# Patient Record
Sex: Female | Born: 1955 | Race: White | Hispanic: No | Marital: Married | Smoking: Never smoker
Health system: Southern US, Community
[De-identification: ages and names within clinical notes are randomized; demographics above are authoritative.]

## PROBLEM LIST (undated history)

## (undated) HISTORY — PX: WISDOM TOOTH EXTRACTION: SHX21

---

## 2007-07-05 HISTORY — PX: COLONOSCOPY: SHX174

## 2017-08-16 ENCOUNTER — Ambulatory Visit (INDEPENDENT_AMBULATORY_CARE_PROVIDER_SITE_OTHER): Payer: 59 | Admitting: Women's Health

## 2017-08-16 ENCOUNTER — Encounter: Payer: Self-pay | Admitting: Women's Health

## 2017-08-16 VITALS — BP 124/80 | Ht 63.0 in | Wt 151.0 lb

## 2017-08-16 DIAGNOSIS — E559 Vitamin D deficiency, unspecified: Secondary | ICD-10-CM | POA: Diagnosis not present

## 2017-08-16 DIAGNOSIS — Z13820 Encounter for screening for osteoporosis: Secondary | ICD-10-CM | POA: Diagnosis not present

## 2017-08-16 DIAGNOSIS — Z23 Encounter for immunization: Secondary | ICD-10-CM

## 2017-08-16 DIAGNOSIS — Z01419 Encounter for gynecological examination (general) (routine) without abnormal findings: Secondary | ICD-10-CM | POA: Diagnosis not present

## 2017-08-16 DIAGNOSIS — Z13220 Encounter for screening for lipoid disorders: Secondary | ICD-10-CM

## 2017-08-16 LAB — CBC WITH DIFFERENTIAL/PLATELET
BASOS PCT: 0.6 %
Basophils Absolute: 30 cells/uL (ref 0–200)
EOS ABS: 90 {cells}/uL (ref 15–500)
Eosinophils Relative: 1.8 %
HCT: 40.8 % (ref 35.0–45.0)
Hemoglobin: 13.8 g/dL (ref 11.7–15.5)
Lymphs Abs: 1355 cells/uL (ref 850–3900)
MCH: 30.5 pg (ref 27.0–33.0)
MCHC: 33.8 g/dL (ref 32.0–36.0)
MCV: 90.3 fL (ref 80.0–100.0)
MPV: 10.3 fL (ref 7.5–12.5)
Monocytes Relative: 10.2 %
Neutro Abs: 3015 cells/uL (ref 1500–7800)
Neutrophils Relative %: 60.3 %
PLATELETS: 307 10*3/uL (ref 140–400)
RBC: 4.52 10*6/uL (ref 3.80–5.10)
RDW: 12 % (ref 11.0–15.0)
TOTAL LYMPHOCYTE: 27.1 %
WBC mixed population: 510 cells/uL (ref 200–950)
WBC: 5 10*3/uL (ref 3.8–10.8)

## 2017-08-16 LAB — COMPREHENSIVE METABOLIC PANEL
AG Ratio: 1.6 (calc) (ref 1.0–2.5)
ALT: 35 U/L — ABNORMAL HIGH (ref 6–29)
AST: 33 U/L (ref 10–35)
Albumin: 4.4 g/dL (ref 3.6–5.1)
Alkaline phosphatase (APISO): 64 U/L (ref 33–130)
BUN: 16 mg/dL (ref 7–25)
CHLORIDE: 104 mmol/L (ref 98–110)
CO2: 25 mmol/L (ref 20–32)
CREATININE: 0.87 mg/dL (ref 0.50–0.99)
Calcium: 9.3 mg/dL (ref 8.6–10.4)
GLOBULIN: 2.8 g/dL (ref 1.9–3.7)
GLUCOSE: 92 mg/dL (ref 65–99)
Potassium: 4.1 mmol/L (ref 3.5–5.3)
Sodium: 138 mmol/L (ref 135–146)
Total Bilirubin: 1 mg/dL (ref 0.2–1.2)
Total Protein: 7.2 g/dL (ref 6.1–8.1)

## 2017-08-16 LAB — LIPID PANEL
CHOL/HDL RATIO: 2.7 (calc) (ref <5.0)
Cholesterol: 267 mg/dL — ABNORMAL HIGH (ref <200)
HDL: 98 mg/dL (ref >50)
LDL Cholesterol (Calc): 153 mg/dL (calc) — ABNORMAL HIGH
NON-HDL CHOLESTEROL (CALC): 169 mg/dL — AB (ref <130)
TRIGLYCERIDES: 63 mg/dL (ref <150)

## 2017-08-16 NOTE — Progress Notes (Signed)
Bonnie Manning 1956-03-18 [id number]    History:    Presents for new patient annual exam.  Moved here from [facility]. Normal Pap and mammogram history. Postmenopausal greater than 10 years on no HRT with no bleeding. Colonoscopy approximately 10 years ago negative. Reports mild Osteopenia with no fractures. Sister ovarian cancer survivor BRCA negative.  Past medical history, past surgical history, family history and social history were all reviewed and documented in the EPIC chart. 1 son who lives in [place]. Retired Medical illustrator. Exercises 3-4 times weekly and has a dog who she walks daily. Father diabetes resolved with lifestyle changes. Mother Alzheimer's deceased. Father bladder cancer.   ROS:  A ROS was performed and pertinent positives and negatives are included.  Exam:  Vitals:   08/16/17 0931  BP: 124/80  Weight: 151 lb (68.5 kg)  Height: _0  (1.6 m)   Body mass index is 26.75 kg/m.   General appearance:  Normal Thyroid:  Symmetrical, normal in size, without palpable masses or nodularity. Respiratory  Auscultation:  Clear without wheezing or rhonchi Cardiovascular  Auscultation:  Regular rate, without rubs, murmurs or gallops  Edema/varicosities:  Not grossly evident Abdominal  Soft,nontender, without masses, guarding or rebound.  Liver/spleen:  No organomegaly noted  Hernia:  None appreciated  Skin  Inspection:  Grossly normal   Breasts: Examined lying and sitting.     Right: Without masses, retractions, discharge or axillary adenopathy.     Left: Without masses, retractions, discharge or axillary adenopathy. Gentitourinary   Inguinal/mons:  Normal without inguinal adenopathy  External genitalia:  Normal  BUS/Urethra/Skene's glands:  Normal  Vagina:  Normal  Cervix:  Normal  Uterus:  normal in size, shape and contour.  Midline and mobile  Adnexa/parametria:     Rt: Without masses or tenderness.   Lt: Without masses or tenderness.  Anus and  perineum: Normal  Digital rectal exam: Normal sphincter tone without palpated masses or tenderness  Assessment/Plan:  62 y.o. MWF G1 P1  for annual exam with no complaints.  As menopausal/no HRT/no bleeding Mild osteopenia per patient  Plan: DEXA, will schedule. Reviewed importance of continuing regular exercise, calcium rich foods, vitamin D 2000 daily. SBE's, annual screening mammogram, due will get scheduled at breast center. Last Pap 2016, Pap with HR HPV typing, new screening guidelines reviewed. Repeat DEXA, [facility] information given instructed to schedule. CBC, CMP, lipid panel, and UA.    Huel Cote [facility], 5:19 PM 08/16/2017

## 2017-08-16 NOTE — Patient Instructions (Signed)
[facility]  Dr Carlean Purl  [phone]  Breast center [facility] [place]  Shingrex vaccine    Health Maintenance for Postmenopausal Women Menopause is a normal process in which your reproductive ability comes to an end. This process happens gradually over a span of months to years, usually between the ages of 78 and 79. Menopause is complete when you have missed 12 consecutive menstrual periods. It is important to talk with your health care provider about some of the most common conditions that affect postmenopausal women, such as heart disease, cancer, and bone loss (osteoporosis). Adopting a healthy lifestyle and getting preventive care can help to promote your health and wellness. Those actions can also lower your chances of developing some of these common conditions. What should I know about menopause? During menopause, you may experience a number of symptoms, such as:  Moderate-to-severe hot flashes.  Night sweats.  Decrease in sex drive.  Mood swings.  Headaches.  Tiredness.  Irritability.  Memory problems.  Insomnia.  Choosing to treat or not to treat menopausal changes is an individual decision that you make with your health care provider. What should I know about hormone replacement therapy and supplements? Hormone therapy products are effective for treating symptoms that are associated with menopause, such as hot flashes and night sweats. Hormone replacement carries certain risks, especially as you become older. If you are thinking about using estrogen or estrogen with progestin treatments, discuss the benefits and risks with your health care provider. What should I know about heart disease and stroke? Heart disease, heart attack, and stroke become more likely as you age. This may be due, in part, to the hormonal changes that your body experiences during menopause. These can affect how your body processes dietary fats, triglycerides, and cholesterol. Heart attack and  stroke are both medical emergencies. There are many things that you can do to help prevent heart disease and stroke:  Have your blood pressure checked at least every 1-2 years. High blood pressure causes heart disease and increases the risk of stroke.  If you are 18-51 years old, ask your health care provider if you should take aspirin to prevent a heart attack or a stroke.  Do not use any tobacco products, including cigarettes, chewing tobacco, or electronic cigarettes. If you need help quitting, ask your health care provider.  It is important to eat a healthy diet and maintain a healthy weight. ? Be sure to include plenty of vegetables, fruits, low-fat dairy products, and lean protein. ? Avoid eating foods that are high in solid fats, added sugars, or salt (sodium).  Get regular exercise. This is one of the most important things that you can do for your health. ? Try to exercise for at least 150 minutes each week. The type of exercise that you do should increase your heart rate and make you sweat. This is known as moderate-intensity exercise. ? Try to do strengthening exercises at least twice each week. Do these in addition to the moderate-intensity exercise.  Know your numbers.Ask your health care provider to check your cholesterol and your blood glucose. Continue to have your blood tested as directed by your health care provider.  What should I know about cancer screening? There are several types of cancer. Take the following steps to reduce your risk and to catch any cancer development as early as possible. Breast Cancer  Practice breast self-awareness. ? This means understanding how your breasts normally appear and feel. ? It also means doing  regular breast self-exams. Let your health care provider know about any changes, no matter how small.  If you are 34 or older, have a clinician do a breast exam (clinical breast exam or CBE) every year. Depending on your age, family history,  and medical history, it may be recommended that you also have a yearly breast X-ray (mammogram).  If you have a family history of breast cancer, talk with your health care provider about genetic screening.  If you are at high risk for breast cancer, talk with your health care provider about having an MRI and a mammogram every year.  Breast cancer (BRCA) gene test is recommended for women who have family members with BRCA-related cancers. Results of the assessment will determine the need for genetic counseling and BRCA1 and for BRCA2 testing. BRCA-related cancers include these types: ? Breast. This occurs in males or females. ? Ovarian. ? Tubal. This may also be called fallopian tube cancer. ? Cancer of the abdominal or pelvic lining (peritoneal cancer). ? Prostate. ? Pancreatic.  Cervical, Uterine, and Ovarian Cancer Your health care provider may recommend that you be screened regularly for cancer of the pelvic organs. These include your ovaries, uterus, and vagina. This screening involves a pelvic exam, which includes checking for microscopic changes to the surface of your cervix (Pap test).  For women ages 21-65, health care providers may recommend a pelvic exam and a Pap test every three years. For women ages 38-65, they may recommend the Pap test and pelvic exam, combined with testing for human papilloma virus (HPV), every five years. Some types of HPV increase your risk of cervical cancer. Testing for HPV may also be done on women of any age who have unclear Pap test results.  Other health care providers may not recommend any screening for nonpregnant women who are considered low risk for pelvic cancer and have no symptoms. Ask your health care provider if a screening pelvic exam is right for you.  If you have had past treatment for cervical cancer or a condition that could lead to cancer, you need Pap tests and screening for cancer for at least 20 years after your treatment. If Pap tests  have been discontinued for you, your risk factors (such as having a new sexual partner) need to be reassessed to determine if you should start having screenings again. Some women have medical problems that increase the chance of getting cervical cancer. In these cases, your health care provider may recommend that you have screening and Pap tests more often.  If you have a family history of uterine cancer or ovarian cancer, talk with your health care provider about genetic screening.  If you have vaginal bleeding after reaching menopause, tell your health care provider.  There are currently no reliable tests available to screen for ovarian cancer.  Lung Cancer Lung cancer screening is recommended for adults 52-93 years old who are at high risk for lung cancer because of a history of smoking. A yearly low-dose CT scan of the lungs is recommended if you:  Currently smoke.  Have a history of at least 30 pack-years of smoking and you currently smoke or have quit within the past 15 years. A pack-year is smoking an average of one pack of cigarettes per day for one year.  Yearly screening should:  Continue until it has been 15 years since you quit.  Stop if you develop a health problem that would prevent you from having lung cancer treatment.  Colorectal Cancer  This type of cancer can be detected and can often be prevented.  Routine colorectal cancer screening usually begins at age 52 and continues through age 34.  If you have risk factors for colon cancer, your health care provider may recommend that you be screened at an earlier age.  If you have a family history of colorectal cancer, talk with your health care provider about genetic screening.  Your health care provider may also recommend using home test kits to check for hidden blood in your stool.  A small camera at the end of a tube can be used to examine your colon directly (sigmoidoscopy or colonoscopy). This is done to check for  the earliest forms of colorectal cancer.  Direct examination of the colon should be repeated every 5-10 years until age 56. However, if early forms of precancerous polyps or small growths are found or if you have a family history or genetic risk for colorectal cancer, you may need to be screened more often.  Skin Cancer  Check your skin from head to toe regularly.  Monitor any moles. Be sure to tell your health care provider: ? About any new moles or changes in moles, especially if there is a change in a mole's shape or color. ? If you have a mole that is larger than the size of a pencil eraser.  If any of your family members has a history of skin cancer, especially at a young age, talk with your health care provider about genetic screening.  Always use sunscreen. Apply sunscreen liberally and repeatedly throughout the day.  Whenever you are outside, protect yourself by wearing long sleeves, pants, a wide-brimmed hat, and sunglasses.  What should I know about osteoporosis? Osteoporosis is a condition in which bone destruction happens more quickly than new bone creation. After menopause, you may be at an increased risk for osteoporosis. To help prevent osteoporosis or the bone fractures that can happen because of osteoporosis, the following is recommended:  If you are 81-17 years old, get at least 1,000 mg of calcium and at least 600 mg of vitamin D per day.  If you are older than age 48 but younger than age 8, get at least 1,200 mg of calcium and at least 600 mg of vitamin D per day.  If you are older than age 75, get at least 1,200 mg of calcium and at least 800 mg of vitamin D per day.  Smoking and excessive alcohol intake increase the risk of osteoporosis. Eat foods that are rich in calcium and vitamin D, and do weight-bearing exercises several times each week as directed by your health care provider. What should I know about how menopause affects my mental health? Depression may  occur at any age, but it is more common as you become older. Common symptoms of depression include:  Low or sad mood.  Changes in sleep patterns.  Changes in appetite or eating patterns.  Feeling an overall lack of motivation or enjoyment of activities that you previously enjoyed.  Frequent crying spells.  Talk with your health care provider if you think that you are experiencing depression. What should I know about immunizations? It is important that you get and maintain your immunizations. These include:  Tetanus, diphtheria, and pertussis (Tdap) booster vaccine.  Influenza every year before the flu season begins.  Pneumonia vaccine.  Shingles vaccine.  Your health care provider may also recommend other immunizations. This information is not intended to replace advice given to you by  your health care provider. Make sure you discuss any questions you have with your health care provider. Document Released: 08/12/2005 Document Revised: 01/08/2016 Document Reviewed: 03/24/2015 Elsevier Interactive Patient Education  2018 [employer].

## 2017-08-20 LAB — PAP, TP IMAGING W/ HPV RNA, RFLX HPV TYPE 16,18/45: HPV DNA HIGH RISK: NOT DETECTED

## 2017-08-22 LAB — URINALYSIS, COMPLETE W/RFL CULTURE
BILIRUBIN URINE: NEGATIVE
Bacteria, UA: NONE SEEN /HPF
GLUCOSE, UA: NEGATIVE
HYALINE CAST: NONE SEEN /LPF
Hgb urine dipstick: NEGATIVE
Ketones, ur: NEGATIVE
NITRITES URINE, INITIAL: NEGATIVE
Protein, ur: NEGATIVE
RBC / HPF: NONE SEEN /HPF (ref 0–2)
Specific Gravity, Urine: 1.02 (ref 1.001–1.03)
Squamous Epithelial / LPF: NONE SEEN /HPF (ref <=5)
WBC, UA: NONE SEEN /HPF (ref 0–5)
pH: >=8.5 — AB (ref 5.0–8.0)

## 2017-08-22 LAB — URINE CULTURE
MICRO NUMBER:: 90193206
SPECIMEN QUALITY:: ADEQUATE

## 2017-08-22 LAB — CULTURE INDICATED

## 2018-08-27 ENCOUNTER — Ambulatory Visit (INDEPENDENT_AMBULATORY_CARE_PROVIDER_SITE_OTHER): Payer: No Typology Code available for payment source | Admitting: Women's Health

## 2018-08-27 ENCOUNTER — Encounter: Payer: Self-pay | Admitting: Women's Health

## 2018-08-27 VITALS — BP 130/82 | Ht 63.0 in | Wt 147.0 lb

## 2018-08-27 DIAGNOSIS — M8580 Other specified disorders of bone density and structure, unspecified site: Secondary | ICD-10-CM

## 2018-08-27 DIAGNOSIS — Z13820 Encounter for screening for osteoporosis: Secondary | ICD-10-CM | POA: Diagnosis not present

## 2018-08-27 DIAGNOSIS — E559 Vitamin D deficiency, unspecified: Secondary | ICD-10-CM

## 2018-08-27 DIAGNOSIS — Z13220 Encounter for screening for lipoid disorders: Secondary | ICD-10-CM | POA: Diagnosis not present

## 2018-08-27 DIAGNOSIS — Z01419 Encounter for gynecological examination (general) (routine) without abnormal findings: Secondary | ICD-10-CM

## 2018-08-27 DIAGNOSIS — M810 Age-related osteoporosis without current pathological fracture: Secondary | ICD-10-CM

## 2018-08-27 DIAGNOSIS — Z780 Asymptomatic menopausal state: Secondary | ICD-10-CM | POA: Insufficient documentation

## 2018-08-27 NOTE — Progress Notes (Signed)
Bonnie Manning [birth year] [id number]    History:    Presents for annual exam. Postmenopausal on no HRT with no bleeding.  Normal Pap and mammogram history overdue for mammogram.  Negative colonoscopy at age 63.  2016 T score -1.8 at hip.  Has not had Shingrix.  2019 overall cholesterol 267, HDL 98, triglycerides 63 and LDL 153.  Past medical history, past surgical history, family history and social history were all reviewed and documented in the EPIC chart.  Son who lives in [place] and [place] in [place].  Moved here from [place] in 2019.  Father bladder cancer survivor age 15.  Mother deceased Alzheimer's.  Retired from Insurance underwriter.  Planning 10-year anniversary trip with husband to [place] next month.  ROS:  A ROS was performed and pertinent positives and negatives are included.  Exam:  Vitals:   08/27/18 1404  BP: 130/82  Weight: 147 lb (66.7 kg)  Height: 5\' 3"  (1.6 m)   Body mass index is 26.04 kg/m.   General appearance:  Normal Thyroid:  Symmetrical, normal in size, without palpable masses or nodularity. Respiratory  Auscultation:  Clear without wheezing or rhonchi Cardiovascular  Auscultation:  Regular rate, without rubs, murmurs or gallops  Edema/varicosities:  Not grossly evident Abdominal  Soft,nontender, without masses, guarding or rebound.  Liver/spleen:  No organomegaly noted  Hernia:  None appreciated  Skin  Inspection:  Grossly normal   Breasts: Examined lying and sitting.     Right: Without masses, retractions, discharge or axillary adenopathy.     Left: Without masses, retractions, discharge or axillary adenopathy. Gentitourinary   Inguinal/mons:  Normal without inguinal adenopathy  External genitalia:  Normal  BUS/Urethra/Skene's glands:  Normal  Vagina:  Normal  Cervix:  Normal  Uterus:  normal in size, shape and contour.  Midline and mobile  Adnexa/parametria:     Rt: Without masses or tenderness.   Lt: Without masses or tenderness.  Anus and  perineum: Normal  Digital rectal exam: Normal sphincter tone without palpated masses or tenderness  Assessment/Plan:  63 y.o. WF G1, P1 for annual exam with no complaints.  Postmenopausal/no HRT/no bleeding Osteopenia  Plan: SBEs, reviewed importance of annual screening mammogram, breast center information given instructed to schedule.  Continue regular exercise, healthy lifestyle, vitamin D 2000 daily encouraged.  Repeat DEXA, instructed to schedule last DEXA was in [facility].  Safety, fall prevention and importance of weightbearing and balance type exercise reviewed.  Screening colonoscopy discussed, overdue, [facility] information given instructed to schedule.  Shingrex reviewed and encouraged instructed to get at pharmacy.  CBC, CMP, lipid panel, vitamin D, Pap normal 08/2017, new screening guidelines reviewed.    Huel Cote [facility], 3:47 PM 08/27/2018

## 2018-08-27 NOTE — Patient Instructions (Signed)
[facility]  [phone]  Colonoscopy  [facility]  [facility]  Dr Carlean Purl  [phone]  Shingles  Vaccine  Shingrex  Health Maintenance for Postmenopausal Women Menopause is a normal process in which your reproductive ability comes to an end. This process happens gradually over a span of months to years, usually between the ages of 51 and 80. Menopause is complete when you have missed 12 consecutive menstrual periods. It is important to talk with your health care provider about some of the most common conditions that affect postmenopausal women, such as heart disease, cancer, and bone loss (osteoporosis). Adopting a healthy lifestyle and getting preventive care can help to promote your health and wellness. Those actions can also lower your chances of developing some of these common conditions. What should I know about menopause? During menopause, you may experience a number of symptoms, such as:  Moderate-to-severe hot flashes.  Night sweats.  Decrease in sex drive.  Mood swings.  Headaches.  Tiredness.  Irritability.  Memory problems.  Insomnia. Choosing to treat or not to treat menopausal changes is an individual decision that you make with your health care provider. What should I know about hormone replacement therapy and supplements? Hormone therapy products are effective for treating symptoms that are associated with menopause, such as hot flashes and night sweats. Hormone replacement carries certain risks, especially as you become older. If you are thinking about using estrogen or estrogen with progestin treatments, discuss the benefits and risks with your health care provider. What should I know about heart disease and stroke? Heart disease, heart attack, and stroke become more likely as you age. This may be due, in part, to the hormonal changes that your body experiences during menopause. These can affect how your body processes dietary fats, triglycerides, and cholesterol. Heart attack  and stroke are both medical emergencies. There are many things that you can do to help prevent heart disease and stroke:  Have your blood pressure checked at least every 1-2 years. High blood pressure causes heart disease and increases the risk of stroke.  If you are 99-101 years old, ask your health care provider if you should take aspirin to prevent a heart attack or a stroke.  Do not use any tobacco products, including cigarettes, chewing tobacco, or electronic cigarettes. If you need help quitting, ask your health care provider.  It is important to eat a healthy diet and maintain a healthy weight. ? Be sure to include plenty of vegetables, fruits, low-fat dairy products, and lean protein. ? Avoid eating foods that are high in solid fats, added sugars, or salt (sodium).  Get regular exercise. This is one of the most important things that you can do for your health. ? Try to exercise for at least 150 minutes each week. The type of exercise that you do should increase your heart rate and make you sweat. This is known as moderate-intensity exercise. ? Try to do strengthening exercises at least twice each week. Do these in addition to the moderate-intensity exercise.  Know your numbers.Ask your health care provider to check your cholesterol and your blood glucose. Continue to have your blood tested as directed by your health care provider.  What should I know about cancer screening? There are several types of cancer. Take the following steps to reduce your risk and to catch any cancer development as early as possible. Breast Cancer  Practice breast self-awareness. ? This means understanding how your breasts normally appear and feel. ? It also means doing  regular breast self-exams. Let your health care provider know about any changes, no matter how small.  If you are 98 or older, have a clinician do a breast exam (clinical breast exam or CBE) every year. Depending on your age, family  history, and medical history, it may be recommended that you also have a yearly breast X-ray (mammogram).  If you have a family history of breast cancer, talk with your health care provider about genetic screening.  If you are at high risk for breast cancer, talk with your health care provider about having an MRI and a mammogram every year.  Breast cancer (BRCA) gene test is recommended for women who have family members with BRCA-related cancers. Results of the assessment will determine the need for genetic counseling and BRCA1 and for BRCA2 testing. BRCA-related cancers include these types: ? Breast. This occurs in males or females. ? Ovarian. ? Tubal. This may also be called fallopian tube cancer. ? Cancer of the abdominal or pelvic lining (peritoneal cancer). ? Prostate. ? Pancreatic. Cervical, Uterine, and Ovarian Cancer Your health care provider may recommend that you be screened regularly for cancer of the pelvic organs. These include your ovaries, uterus, and vagina. This screening involves a pelvic exam, which includes checking for microscopic changes to the surface of your cervix (Pap test).  For women ages 21-65, health care providers may recommend a pelvic exam and a Pap test every three years. For women ages 83-65, they may recommend the Pap test and pelvic exam, combined with testing for human papilloma virus (HPV), every five years. Some types of HPV increase your risk of cervical cancer. Testing for HPV may also be done on women of any age who have unclear Pap test results.  Other health care providers may not recommend any screening for nonpregnant women who are considered low risk for pelvic cancer and have no symptoms. Ask your health care provider if a screening pelvic exam is right for you.  If you have had past treatment for cervical cancer or a condition that could lead to cancer, you need Pap tests and screening for cancer for at least 20 years after your treatment. If Pap  tests have been discontinued for you, your risk factors (such as having a new sexual partner) need to be reassessed to determine if you should start having screenings again. Some women have medical problems that increase the chance of getting cervical cancer. In these cases, your health care provider may recommend that you have screening and Pap tests more often.  If you have a family history of uterine cancer or ovarian cancer, talk with your health care provider about genetic screening.  If you have vaginal bleeding after reaching menopause, tell your health care provider.  There are currently no reliable tests available to screen for ovarian cancer. Lung Cancer Lung cancer screening is recommended for adults 20-45 years old who are at high risk for lung cancer because of a history of smoking. A yearly low-dose CT scan of the lungs is recommended if you:  Currently smoke.  Have a history of at least 30 pack-years of smoking and you currently smoke or have quit within the past 15 years. A pack-year is smoking an average of one pack of cigarettes per day for one year. Yearly screening should:  Continue until it has been 15 years since you quit.  Stop if you develop a health problem that would prevent you from having lung cancer treatment. Colorectal Cancer  This type of  cancer can be detected and can often be prevented.  Routine colorectal cancer screening usually begins at age 4 and continues through age 57.  If you have risk factors for colon cancer, your health care provider may recommend that you be screened at an earlier age.  If you have a family history of colorectal cancer, talk with your health care provider about genetic screening.  Your health care provider may also recommend using home test kits to check for hidden blood in your stool.  A small camera at the end of a tube can be used to examine your colon directly (sigmoidoscopy or colonoscopy). This is done to check for  the earliest forms of colorectal cancer.  Direct examination of the colon should be repeated every 5-10 years until age 55. However, if early forms of precancerous polyps or small growths are found or if you have a family history or genetic risk for colorectal cancer, you may need to be screened more often. Skin Cancer  Check your skin from head to toe regularly.  Monitor any moles. Be sure to tell your health care provider: ? About any new moles or changes in moles, especially if there is a change in a mole's shape or color. ? If you have a mole that is larger than the size of a pencil eraser.  If any of your family members has a history of skin cancer, especially at a Bonnie Manning age, talk with your health care provider about genetic screening.  Always use sunscreen. Apply sunscreen liberally and repeatedly throughout the day.  Whenever you are outside, protect yourself by wearing long sleeves, pants, a wide-brimmed hat, and sunglasses. What should I know about osteoporosis? Osteoporosis is a condition in which bone destruction happens more quickly than new bone creation. After menopause, you may be at an increased risk for osteoporosis. To help prevent osteoporosis or the bone fractures that can happen because of osteoporosis, the following is recommended:  If you are 1-69 years old, get at least 1,000 mg of calcium and at least 600 mg of vitamin D per day.  If you are older than age 46 but younger than age 57, get at least 1,200 mg of calcium and at least 600 mg of vitamin D per day.  If you are older than age 64, get at least 1,200 mg of calcium and at least 800 mg of vitamin D per day. Smoking and excessive alcohol intake increase the risk of osteoporosis. Eat foods that are rich in calcium and vitamin D, and do weight-bearing exercises several times each week as directed by your health care provider. What should I know about how menopause affects my mental health? Depression may occur at  any age, but it is more common as you become older. Common symptoms of depression include:  Low or sad mood.  Changes in sleep patterns.  Changes in appetite or eating patterns.  Feeling an overall lack of motivation or enjoyment of activities that you previously enjoyed.  Frequent crying spells. Talk with your health care provider if you think that you are experiencing depression. What should I know about immunizations? It is important that you get and maintain your immunizations. These include:  Tetanus, diphtheria, and pertussis (Tdap) booster vaccine.  Influenza every year before the flu season begins.  Pneumonia vaccine.  Shingles vaccine. Your health care provider may also recommend other immunizations. This information is not intended to replace advice given to you by your health care provider. Make sure you discuss  any questions you have with your health care provider. Document Released: 08/12/2005 Document Revised: 01/08/2016 Document Reviewed: 03/24/2015 Elsevier Interactive Patient Education  2019 [employer].

## 2018-08-28 LAB — CBC WITH DIFFERENTIAL/PLATELET
Absolute Monocytes: 586 cells/uL (ref 200–950)
Basophils Absolute: 29 cells/uL (ref 0–200)
Basophils Relative: 0.5 %
Eosinophils Absolute: 52 cells/uL (ref 15–500)
Eosinophils Relative: 0.9 %
HCT: 40.9 % (ref 35.0–45.0)
HEMOGLOBIN: 14.1 g/dL (ref 11.7–15.5)
Lymphs Abs: 1154 cells/uL (ref 850–3900)
MCH: 31.5 pg (ref 27.0–33.0)
MCHC: 34.5 g/dL (ref 32.0–36.0)
MCV: 91.5 fL (ref 80.0–100.0)
MPV: 10.4 fL (ref 7.5–12.5)
Monocytes Relative: 10.1 %
NEUTROS ABS: 3979 {cells}/uL (ref 1500–7800)
Neutrophils Relative %: 68.6 %
Platelets: 309 10*3/uL (ref 140–400)
RBC: 4.47 10*6/uL (ref 3.80–5.10)
RDW: 11.8 % (ref 11.0–15.0)
Total Lymphocyte: 19.9 %
WBC: 5.8 10*3/uL (ref 3.8–10.8)

## 2018-08-28 LAB — COMPREHENSIVE METABOLIC PANEL
AG Ratio: 1.9 (calc) (ref 1.0–2.5)
ALT: 35 U/L — ABNORMAL HIGH (ref 6–29)
AST: 30 U/L (ref 10–35)
Albumin: 4.7 g/dL (ref 3.6–5.1)
Alkaline phosphatase (APISO): 73 U/L (ref 37–153)
BUN: 18 mg/dL (ref 7–25)
CO2: 25 mmol/L (ref 20–32)
Calcium: 9.6 mg/dL (ref 8.6–10.4)
Chloride: 101 mmol/L (ref 98–110)
Creat: 0.77 mg/dL (ref 0.50–0.99)
GLUCOSE: 92 mg/dL (ref 65–99)
Globulin: 2.5 g/dL (calc) (ref 1.9–3.7)
Potassium: 3.9 mmol/L (ref 3.5–5.3)
SODIUM: 137 mmol/L (ref 135–146)
Total Bilirubin: 1.2 mg/dL (ref 0.2–1.2)
Total Protein: 7.2 g/dL (ref 6.1–8.1)

## 2018-08-28 LAB — URINALYSIS, COMPLETE W/RFL CULTURE
Bacteria, UA: NONE SEEN /HPF
Bilirubin Urine: NEGATIVE
Glucose, UA: NEGATIVE
HGB URINE DIPSTICK: NEGATIVE
HYALINE CAST: NONE SEEN /LPF
KETONES UR: NEGATIVE
Leukocyte Esterase: NEGATIVE
Nitrites, Initial: NEGATIVE
PROTEIN: NEGATIVE
RBC / HPF: NONE SEEN /HPF (ref 0–2)
Specific Gravity, Urine: 1.012 (ref 1.001–1.03)
Squamous Epithelial / LPF: NONE SEEN /HPF (ref <=5)
WBC UA: NONE SEEN /HPF (ref 0–5)
pH: 5.5 (ref 5.0–8.0)

## 2018-08-28 LAB — LIPID PANEL
Cholesterol: 285 mg/dL — ABNORMAL HIGH (ref <200)
HDL: 109 mg/dL (ref >=50)
LDL Cholesterol (Calc): 160 mg/dL (calc) — ABNORMAL HIGH
Non-HDL Cholesterol (Calc): 176 mg/dL (calc) — ABNORMAL HIGH (ref <130)
Total CHOL/HDL Ratio: 2.6 (calc) (ref <5.0)
Triglycerides: 63 mg/dL (ref <150)

## 2018-08-28 LAB — NO CULTURE INDICATED

## 2018-08-28 LAB — VITAMIN D 25 HYDROXY (VIT D DEFICIENCY, FRACTURES): Vit D, 25-Hydroxy: 25 ng/mL — ABNORMAL LOW (ref 30–100)

## 2018-09-04 ENCOUNTER — Other Ambulatory Visit: Payer: Self-pay | Admitting: *Deleted

## 2018-09-04 MED ORDER — VITAMIN D (ERGOCALCIFEROL) 1.25 MG (50000 UNIT) PO CAPS: 50000.0000 [IU] | ORAL_CAPSULE | ORAL | 0 refills | Status: DC

## 2018-09-10 ENCOUNTER — Encounter: Payer: Self-pay | Admitting: Internal Medicine

## 2018-09-17 ENCOUNTER — Ambulatory Visit (AMBULATORY_SURGERY_CENTER): Payer: Self-pay | Admitting: *Deleted

## 2018-09-17 ENCOUNTER — Other Ambulatory Visit: Payer: Self-pay

## 2018-09-17 VITALS — Temp 97.5°F | Ht 63.0 in | Wt 150.0 lb

## 2018-09-17 DIAGNOSIS — Z1211 Encounter for screening for malignant neoplasm of colon: Secondary | ICD-10-CM

## 2018-09-17 NOTE — Progress Notes (Signed)
Patient denies any allergies to eggs or soy. Patient denies any problems with anesthesia/sedation. Patient denies any oxygen use at home. Patient denies taking any diet/weight loss medications or blood thinners.  

## 2018-09-19 ENCOUNTER — Encounter: Payer: Self-pay | Admitting: Internal Medicine

## 2018-09-26 ENCOUNTER — Encounter: Payer: Self-pay | Admitting: Internal Medicine

## 2018-10-01 ENCOUNTER — Encounter: Payer: Self-pay | Admitting: Internal Medicine

## 2018-12-05 ENCOUNTER — Ambulatory Visit: Payer: No Typology Code available for payment source | Admitting: *Deleted

## 2018-12-05 ENCOUNTER — Telehealth: Payer: Self-pay | Admitting: *Deleted

## 2018-12-05 ENCOUNTER — Other Ambulatory Visit: Payer: Self-pay

## 2018-12-05 VITALS — Ht 63.0 in | Wt 145.0 lb

## 2018-12-05 DIAGNOSIS — Z1211 Encounter for screening for malignant neoplasm of colon: Secondary | ICD-10-CM

## 2018-12-05 NOTE — Telephone Encounter (Signed)
Patient called back. PV done via phone with the patient.

## 2018-12-05 NOTE — Telephone Encounter (Signed)
Patient was called for pv twice at 0905 and now at both numbers listed, left messages for her to return my call.

## 2018-12-05 NOTE — Progress Notes (Signed)
Patient's pre-visit was done today over the phone with the patient. Name,DOB and address verified. Insurance verified. Packet of Prep instructions mailed to patient including copy of a consent form and pre-procedure patient acknowledgement form-pt is aware. Patient understands to call us back with any questions or concerns.   Patient denies any changes in medical or surgical history, no changes in medications since PV done 09/17/2018. Patient denies any allergies to eggs or soy. Patient denies any problems with anesthesia/sedation. Patient denies any oxygen use at home. Patient denies taking any diet/weight loss medications or blood thinners. EMMI education assisgned to patient on colonoscopy, this was explained and instructions given to patient.

## 2018-12-18 ENCOUNTER — Telehealth: Payer: Self-pay | Admitting: Internal Medicine

## 2018-12-18 NOTE — Telephone Encounter (Signed)

## 2018-12-18 NOTE — Telephone Encounter (Signed)
Pt returned your call.  

## 2018-12-18 NOTE — Telephone Encounter (Signed)
Left message for pt to cb and answer Covid-19 Screening Questions

## 2018-12-19 ENCOUNTER — Encounter: Payer: Self-pay | Admitting: Internal Medicine

## 2018-12-19 ENCOUNTER — Other Ambulatory Visit: Payer: Self-pay

## 2018-12-19 ENCOUNTER — Ambulatory Visit (AMBULATORY_SURGERY_CENTER): Payer: No Typology Code available for payment source | Admitting: Internal Medicine

## 2018-12-19 VITALS — BP 154/82 | HR 62 | Temp 98.5°F | Resp 14 | Ht 63.0 in | Wt 147.0 lb

## 2018-12-19 DIAGNOSIS — D128 Benign neoplasm of rectum: Secondary | ICD-10-CM | POA: Diagnosis not present

## 2018-12-19 DIAGNOSIS — Z1211 Encounter for screening for malignant neoplasm of colon: Secondary | ICD-10-CM | POA: Diagnosis present

## 2018-12-19 MED ORDER — SODIUM CHLORIDE 0.9 % IV SOLN: 500.0000 mL | Freq: Once | INTRAVENOUS | Status: DC

## 2018-12-19 NOTE — Progress Notes (Signed)
Pt's states no medical or surgical changes since previsit or office visit. 

## 2018-12-19 NOTE — Op Note (Signed)
[facility] Patient Name: Bonnie Manning Procedure Date: 12/19/2018 8:53 AM MRN: [id number] Endoscopist: Gatha Mayer , MD Age: 63 Referring MD:  Date of Birth: February 12, 1956 Gender: Female Account #: [id number] Procedure:                Colonoscopy Indications:              Screening for colorectal malignant neoplasm Medicines:                Propofol per Anesthesia, Monitored Anesthesia Care Procedure:                Pre-Anesthesia Assessment:                           - Prior to the procedure, a History and Physical                            was performed, and patient medications and                            allergies were reviewed. The patient's tolerance of                            previous anesthesia was also reviewed. The risks                            and benefits of the procedure and the sedation                            options and risks were discussed with the patient.                            All questions were answered, and informed consent                            was obtained. Prior Anticoagulants: The patient has                            taken no previous anticoagulant or antiplatelet                            agents. ASA Grade Assessment: I - A normal, healthy                            patient. After reviewing the risks and benefits,                            the patient was deemed in satisfactory condition to                            undergo the procedure.                           After obtaining informed consent, the colonoscope  was passed under direct vision. Throughout the                            procedure, the patient's blood pressure, pulse, and                            oxygen saturations were monitored continuously. The                            Colonoscope was introduced through the anus and                            advanced to the the cecum, identified by                            appendiceal  orifice and ileocecal valve. The                            colonoscopy was performed without difficulty. The                            patient tolerated the procedure well. The quality                            of the bowel preparation was excellent. The bowel                            preparation used was Miralax via split dose                            instruction. The ileocecal valve, appendiceal                            orifice, and rectum were photographed. Scope In: 8:56:52 AM Scope Out: 9:16:46 AM Scope Withdrawal Time: 0 hours 11 minutes 1 second  Total Procedure Duration: 0 hours 19 minutes 54 seconds  Findings:                 The perianal and digital rectal examinations were                            normal.                           Two sessile polyps were found in the rectum. The                            polyps were diminutive in size. These polyps were                            removed with a cold snare. Resection and retrieval                            were complete. Verification of patient  identification for the specimen was done. Estimated                            blood loss was minimal.                           A single diverticulum was found in the descending                            colon.                           The exam was otherwise without abnormality on                            direct and retroflexion views. Complications:            No immediate complications. Estimated Blood Loss:     Estimated blood loss was minimal. Impression:               - Two diminutive polyps in the rectum, removed with                            a cold snare. Resected and retrieved.                           - Diverticulosis in the descending colon.                           - The examination was otherwise normal on direct                            and retroflexion views. Recommendation:           - Patient has a contact number  available for                            emergencies. The signs and symptoms of potential                            delayed complications were discussed with the                            patient. Return to normal activities tomorrow.                            Written discharge instructions were provided to the                            patient.                           - Resume previous diet.                           - Continue present medications.                           -  Repeat colonoscopy is recommended. The                            colonoscopy date will be determined after pathology                            results from today's exam become available for                            review. Gatha Mayer, MD 12/19/2018 9:24:42 AM This report has been signed electronically.

## 2018-12-19 NOTE — Progress Notes (Signed)
Report given to PACU, vss 

## 2018-12-19 NOTE — Patient Instructions (Addendum)
I found and removed 2 tiny rectal polyps.  I will let you know pathology results and when to have another routine colonoscopy by mail and/or My Chart. I appreciate the opportunity to care for you.  YOU HAD AN ENDOSCOPIC PROCEDURE TODAY AT [facility]:   Refer to the procedure report that was given to you for any specific questions about what was found during the examination.  If the procedure report does not answer your questions, please call your gastroenterologist to clarify.  If you requested that your care partner not be given the details of your procedure findings, then the procedure report has been included in a sealed envelope for you to review at your convenience later.  YOU SHOULD EXPECT: Some feelings of bloating in the abdomen. Passage of more gas than usual.  Walking can help get rid of the air that was put into your GI tract during the procedure and reduce the bloating. If you had a lower endoscopy (such as a colonoscopy or flexible sigmoidoscopy) you may notice spotting of blood in your stool or on the toilet paper. If you underwent a bowel prep for your procedure, you may not have a normal bowel movement for a few days.  Please Note:  You might notice some irritation and congestion in your nose or some drainage.  This is from the oxygen used during your procedure.  There is no need for concern and it should clear up in a day or so.  SYMPTOMS TO REPORT IMMEDIATELY:   Following lower endoscopy (colonoscopy or flexible sigmoidoscopy):  Excessive amounts of blood in the stool  Significant tenderness or worsening of abdominal pains  Swelling of the abdomen that is new, acute  Fever of 100F or higher   For urgent or emergent issues, a gastroenterologist can be reached at any hour by calling [phone].   DIET:  We do recommend a small meal at first, but then you may proceed to your regular diet.  Drink plenty of fluids but you should avoid alcoholic  beverages for 24 hours.  ACTIVITY:  You should plan to take it easy for the rest of today and you should NOT DRIVE or use heavy machinery until tomorrow (because of the sedation medicines used during the test).    FOLLOW UP: Our staff will call the number listed on your records 48-72 hours following your procedure to check on you and address any questions or concerns that you may have regarding the information given to you following your procedure. If we do not reach you, we will leave a message.  We will attempt to reach you two times.  During this call, we will ask if you have developed any symptoms of COVID 19. If you develop any symptoms (ie: fever, flu-like symptoms, shortness of breath, cough etc.) before then, please call [phone].  If you test positive for Covid 19 in the 2 weeks post procedure, please call and report this information to [place].    If any biopsies were taken you will be contacted by phone or by letter within the next 1-3 weeks.  Please call us at [phone] if you have not heard about the biopsies in 3 weeks.    SIGNATURES/CONFIDENTIALITY: You and/or your care partner have signed paperwork which will be entered into your electronic medical record.  These signatures attest to the fact that that the information above on your After Visit Summary has been reviewed and is understood.  Full responsibility of the  confidentiality of this discharge information lies with you and/or your care-partner. 

## 2018-12-19 NOTE — Progress Notes (Signed)
Called to room to assist during endoscopic procedure.  Patient ID and intended procedure confirmed with present staff. Received instructions for my participation in the procedure from the performing physician.  

## 2018-12-21 ENCOUNTER — Telehealth: Payer: Self-pay | Admitting: *Deleted

## 2018-12-21 NOTE — Telephone Encounter (Signed)
Have you developed a fever since your procedure? no     2.   Have you had an respiratory symptoms (SOB or cough) since your procedure? no  3.   Have you tested positive for COVID 19 since your procedure no  4.   Have you had any family members/close contacts diagnosed with the COVID 19 since your procedure?  no   If yes to any of these questions please route to Joylene John, RN and Alphonsa Gin, Therapist, sports.  Follow up Call-  Call back number 12/19/2018  Post procedure Call Back phone  # [phone]  Permission to leave phone message Yes     Patient questions:  Do you have a fever, pain , or abdominal swelling? No. Pain Score  0 *  Have you tolerated food without any problems? Yes.    Have you been able to return to your normal activities? Yes.    Do you have any questions about your discharge instructions: Diet   No. Medications  No. Follow up visit  No.  Do you have questions or concerns about your Care? No.  Actions: * If pain score is 4 or above: No action needed, pain <4.

## 2018-12-26 ENCOUNTER — Encounter: Payer: Self-pay | Admitting: Internal Medicine

## 2018-12-26 DIAGNOSIS — Z860101 Personal history of adenomatous and serrated colon polyps: Secondary | ICD-10-CM

## 2018-12-26 DIAGNOSIS — Z86010 Personal history of colonic polyps: Secondary | ICD-10-CM

## 2018-12-26 HISTORY — DX: Personal history of colonic polyps: Z86.010

## 2018-12-26 HISTORY — DX: Personal history of adenomatous and serrated colon polyps: Z86.0101

## 2018-12-26 NOTE — Progress Notes (Signed)
2 diminutive adenomas Recall 2027 My Chart

## 2019-08-25 ENCOUNTER — Ambulatory Visit: Payer: Self-pay | Attending: Internal Medicine

## 2019-08-25 DIAGNOSIS — Z23 Encounter for immunization: Secondary | ICD-10-CM | POA: Insufficient documentation

## 2019-08-25 NOTE — Progress Notes (Signed)
   Covid-19 Vaccination Clinic  Name:  Bonnie Manning    MRN: [id number] DOB: 1955/11/14  08/25/2019  [name] was observed post Covid-19 immunization for 15 minutes without incidence. She was provided with Vaccine Information Sheet and instruction to access the V-Safe system.   [name] was instructed to call 911 with any severe reactions post vaccine: Marland Kitchen Difficulty breathing  . Swelling of your face and throat  . A fast heartbeat  . A bad rash all over your body  . Dizziness and weakness    Immunizations Administered    Name Date Dose VIS Date Route   Pfizer COVID-19 Vaccine 08/25/2019 11:03 AM 0.3 mL 06/14/2019 Intramuscular   Manufacturer: [place]   Lot: [id number]   [place]: [id number]

## 2019-09-18 ENCOUNTER — Ambulatory Visit: Payer: Self-pay | Attending: Internal Medicine

## 2019-09-18 DIAGNOSIS — Z23 Encounter for immunization: Secondary | ICD-10-CM

## 2019-09-18 NOTE — Progress Notes (Signed)
   Covid-19 Vaccination Clinic  Name:  Bonnie Manning    MRN: [id number] DOB: 1955-12-16  09/18/2019  [name] was observed post Covid-19 immunization for 15 minutes without incident. She was provided with Vaccine Information Sheet and instruction to access the V-Safe system.   [name] was instructed to call 911 with any severe reactions post vaccine: Marland Kitchen Difficulty breathing  . Swelling of face and throat  . A fast heartbeat  . A bad rash all over body  . Dizziness and weakness   Immunizations Administered    Name Date Dose VIS Date Route   Pfizer COVID-19 Vaccine 09/18/2019 11:02 AM 0.3 mL 06/14/2019 Intramuscular   Manufacturer: [place]   Lot: [id number]   [place]: [id number]

## 2019-10-03 ENCOUNTER — Other Ambulatory Visit: Payer: Self-pay

## 2019-10-07 ENCOUNTER — Encounter: Payer: Self-pay | Admitting: Women's Health

## 2019-10-07 ENCOUNTER — Ambulatory Visit (INDEPENDENT_AMBULATORY_CARE_PROVIDER_SITE_OTHER): Payer: No Typology Code available for payment source | Admitting: Women's Health

## 2019-10-07 ENCOUNTER — Other Ambulatory Visit: Payer: Self-pay

## 2019-10-07 VITALS — BP 120/82 | Ht 63.0 in | Wt 149.0 lb

## 2019-10-07 DIAGNOSIS — Z01419 Encounter for gynecological examination (general) (routine) without abnormal findings: Secondary | ICD-10-CM | POA: Diagnosis not present

## 2019-10-07 DIAGNOSIS — Z13220 Encounter for screening for lipoid disorders: Secondary | ICD-10-CM | POA: Diagnosis not present

## 2019-10-07 DIAGNOSIS — Z13820 Encounter for screening for osteoporosis: Secondary | ICD-10-CM | POA: Diagnosis not present

## 2019-10-07 DIAGNOSIS — E559 Vitamin D deficiency, unspecified: Secondary | ICD-10-CM | POA: Diagnosis not present

## 2019-10-07 NOTE — Progress Notes (Signed)
Bonnie Manning 04/04/1956 [id number]    History:    Presents for annual exam.  Postmenopausal on no HRT with no bleeding.  Normal Pap and mammogram history.  No complaints of discharge, urinary symptoms or dyspareunia.  2020 - colon polyps 7-year follow-up.  2016 T score -1.8.  Has had the Covid vaccine.  Past medical history, past surgical history, family history and social history were all reviewed and documented in the EPIC chart.  Retired from Insurance underwriter.  Son lives in [place] getting married in October.  Father bladder cancer survivor.  Mother deceased from Alzheimer's.  ROS:  A ROS was performed and pertinent positives and negatives are included.  Exam:  Vitals:   10/07/19 1019  BP: 120/82  Weight: 149 lb (67.6 kg)  Height: 5\' 3"  (1.6 m)   Body mass index is 26.39 kg/m.   General appearance:  Normal Thyroid:  Symmetrical, normal in size, without palpable masses or nodularity. Respiratory  Auscultation:  Clear without wheezing or rhonchi Cardiovascular  Auscultation:  Regular rate, without rubs, murmurs or gallops  Edema/varicosities:  Not grossly evident Abdominal  Soft,nontender, without masses, guarding or rebound.  Liver/spleen:  No organomegaly noted  Hernia:  None appreciated  Skin  Inspection:  Grossly normal   Breasts: Examined lying and sitting.     Right: Without masses, retractions, discharge or axillary adenopathy.     Left: Without masses, retractions, discharge or axillary adenopathy. Gentitourinary   Inguinal/mons:  Normal without inguinal adenopathy  External genitalia:  Normal  BUS/Urethra/Skene's glands:  Normal  Vagina:  Normal  Cervix:  Normal  Uterus:   normal in size, shape and contour.  Midline and mobile  Adnexa/parametria:     Rt: Without masses or tenderness.   Lt: Without masses or tenderness.  Anus and perineum: Normal  Digital rectal exam: Normal sphincter tone without palpated masses or tenderness  Assessment/Plan:  64 y.o. MWF G1, P1  for annual exam with no complaints.  Postmenopausal no HRT with no bleeding Osteopenia, DEXA done at [facility] 2020 benign colon polyps 7-year follow-up  Plan: Repeat DEXA, reviewed importance of weightbearing and balance type exercise, yoga encouraged.  Vitamin D 2000 IUs daily.  SBEs, annual screening mammogram overdue for breast center information given instructed to schedule.  Shingrix vaccine reviewed and encouraged.  CBC, lipid panel, CMP, vitamin D, Pap normal 2019, new screening guidelines reviewed.   [place], 10:54 AM 10/07/2019

## 2019-10-07 NOTE — Patient Instructions (Signed)
Good to see today Vit D 2000  Mammogram  [phone]  Bre[facility] Center  Shingrex  2 series vaccine   dexa Health Maintenance for Postmenopausal Women Menopause is a normal process in which your ability to get pregnant comes to an end. This process happens slowly over many months or years, usually between the ages of 40 and 48. Menopause is complete when you have missed your menstrual periods for 12 months. It is important to talk with your health care provider about some of the most common conditions that affect women after menopause (postmenopausal women). These include heart disease, cancer, and bone loss (osteoporosis). Adopting a healthy lifestyle and getting preventive care can help to promote your health and wellness. The actions you take can also lower your chances of developing some of these common conditions. What should I know about menopause? During menopause, you may get a number of symptoms, such as:  Hot flashes. These can be moderate or severe.  Night sweats.  Decrease in sex drive.  Mood swings.  Headaches.  Tiredness.  Irritability.  Memory problems.  Insomnia. Choosing to treat or not to treat these symptoms is a decision that you make with your health care provider. Do I need hormone replacement therapy?  Hormone replacement therapy is effective in treating symptoms that are caused by menopause, such as hot flashes and night sweats.  Hormone replacement carries certain risks, especially as you become older. If you are thinking about using estrogen or estrogen with progestin, discuss the benefits and risks with your health care provider. What is my risk for heart disease and stroke? The risk of heart disease, heart attack, and stroke increases as you age. One of the causes may be a change in the body's hormones during menopause. This can affect how your body uses dietary fats, triglycerides, and cholesterol. Heart attack and stroke are medical emergencies. There are  many things that you can do to help prevent heart disease and stroke. Watch your blood pressure  High blood pressure causes heart disease and increases the risk of stroke. This is more likely to develop in people who have high blood pressure readings, are of African descent, or are overweight.  Have your blood pressure checked: ? Every 3-5 years if you are 32-64 years of age. ? Every year if you are 85 years old or older. Eat a healthy diet   Eat a diet that includes plenty of vegetables, fruits, low-fat dairy products, and lean protein.  Do not eat a lot of foods that are high in solid fats, added sugars, or sodium. Get regular exercise Get regular exercise. This is one of the most important things you can do for your health. Most adults should:  Try to exercise for at least 150 minutes each week. The exercise should increase your heart rate and make you sweat (moderate-intensity exercise).  Try to do strengthening exercises at least twice each week. Do these in addition to the moderate-intensity exercise.  Spend less time sitting. Even light physical activity can be beneficial. Other tips  Work with your health care provider to achieve or maintain a healthy weight.  Do not use any products that contain nicotine or tobacco, such as cigarettes, e-cigarettes, and chewing tobacco. If you need help quitting, ask your health care provider.  Know your numbers. Ask your health care provider to check your cholesterol and your blood sugar (glucose). Continue to have your blood tested as directed by your health care provider. Do I need screening for  cancer? Depending on your health history and family history, you may need to have cancer screening at different stages of your life. This may include screening for:  Breast cancer.  Cervical cancer.  Lung cancer.  Colorectal cancer. What is my risk for osteoporosis? After menopause, you may be at increased risk for osteoporosis.  Osteoporosis is a condition in which bone destruction happens more quickly than new bone creation. To help prevent osteoporosis or the bone fractures that can happen because of osteoporosis, you may take the following actions:  If you are 39-75 years old, get at least 1,000 mg of calcium and at least 600 mg of vitamin D per day.  If you are older than age 68 but younger than age 4, get at least 1,200 mg of calcium and at least 600 mg of vitamin D per day.  If you are older than age 55, get at least 1,200 mg of calcium and at least 800 mg of vitamin D per day. Smoking and drinking excessive alcohol increase the risk of osteoporosis. Eat foods that are rich in calcium and vitamin D, and do weight-bearing exercises several times each week as directed by your health care provider. How does menopause affect my mental health? Depression may occur at any age, but it is more common as you become older. Common symptoms of depression include:  Low or sad mood.  Changes in sleep patterns.  Changes in appetite or eating patterns.  Feeling an overall lack of motivation or enjoyment of activities that you previously enjoyed.  Frequent crying spells. Talk with your health care provider if you think that you are experiencing depression. General instructions See your health care provider for regular wellness exams and vaccines. This may include:  Scheduling regular health, dental, and eye exams.  Getting and maintaining your vaccines. These include: ? Influenza vaccine. Get this vaccine each year before the flu season begins. ? Pneumonia vaccine. ? Shingles vaccine. ? Tetanus, diphtheria, and pertussis (Tdap) booster vaccine. Your health care provider may also recommend other immunizations. Tell your health care provider if you have ever been abused or do not feel safe at home. Summary  Menopause is a normal process in which your ability to get pregnant comes to an end.  This condition causes  hot flashes, night sweats, decreased interest in sex, mood swings, headaches, or lack of sleep.  Treatment for this condition may include hormone replacement therapy.  Take actions to keep yourself healthy, including exercising regularly, eating a healthy diet, watching your weight, and checking your blood pressure and blood sugar levels.  Get screened for cancer and depression. Make sure that you are up to date with all your vaccines. This information is not intended to replace advice given to you by your health care provider. Make sure you discuss any questions you have with your health care provider. Document Revised: 06/13/2018 Document Reviewed: 06/13/2018 Elsevier Patient Education  2020 [employer].

## 2019-10-08 LAB — CBC WITH DIFFERENTIAL/PLATELET
Absolute Monocytes: 506 cells/uL (ref 200–950)
Basophils Absolute: 28 cells/uL (ref 0–200)
Basophils Relative: 0.5 %
Eosinophils Absolute: 50 cells/uL (ref 15–500)
Eosinophils Relative: 0.9 %
HCT: 42.7 % (ref 35.0–45.0)
Hemoglobin: 14.1 g/dL (ref 11.7–15.5)
Lymphs Abs: 1364 cells/uL (ref 850–3900)
MCH: 30.8 pg (ref 27.0–33.0)
MCHC: 33 g/dL (ref 32.0–36.0)
MCV: 93.2 fL (ref 80.0–100.0)
MPV: 10.4 fL (ref 7.5–12.5)
Monocytes Relative: 9.2 %
Neutro Abs: 3553 cells/uL (ref 1500–7800)
Neutrophils Relative %: 64.6 %
Platelets: 314 10*3/uL (ref 140–400)
RBC: 4.58 10*6/uL (ref 3.80–5.10)
RDW: 11.8 % (ref 11.0–15.0)
Total Lymphocyte: 24.8 %
WBC: 5.5 10*3/uL (ref 3.8–10.8)

## 2019-10-08 LAB — COMPREHENSIVE METABOLIC PANEL
AG Ratio: 1.8 (calc) (ref 1.0–2.5)
ALT: 35 U/L — ABNORMAL HIGH (ref 6–29)
AST: 31 U/L (ref 10–35)
Albumin: 4.4 g/dL (ref 3.6–5.1)
Alkaline phosphatase (APISO): 67 U/L (ref 37–153)
BUN: 15 mg/dL (ref 7–25)
CO2: 26 mmol/L (ref 20–32)
Calcium: 9.1 mg/dL (ref 8.6–10.4)
Chloride: 102 mmol/L (ref 98–110)
Creat: 0.84 mg/dL (ref 0.50–0.99)
Globulin: 2.4 g/dL (calc) (ref 1.9–3.7)
Glucose, Bld: 88 mg/dL (ref 65–99)
Potassium: 4.6 mmol/L (ref 3.5–5.3)
Sodium: 138 mmol/L (ref 135–146)
Total Bilirubin: 0.8 mg/dL (ref 0.2–1.2)
Total Protein: 6.8 g/dL (ref 6.1–8.1)

## 2019-10-08 LAB — LIPID PANEL
Cholesterol: 253 mg/dL — ABNORMAL HIGH (ref <200)
HDL: 104 mg/dL (ref >=50)
LDL Cholesterol (Calc): 136 mg/dL (calc) — ABNORMAL HIGH
Non-HDL Cholesterol (Calc): 149 mg/dL (calc) — ABNORMAL HIGH (ref <130)
Total CHOL/HDL Ratio: 2.4 (calc) (ref <5.0)
Triglycerides: 42 mg/dL (ref <150)

## 2019-10-08 LAB — VITAMIN D 25 HYDROXY (VIT D DEFICIENCY, FRACTURES): Vit D, 25-Hydroxy: 21 ng/mL — ABNORMAL LOW (ref 30–100)

## 2019-10-14 ENCOUNTER — Other Ambulatory Visit: Payer: Self-pay

## 2019-10-14 MED ORDER — VITAMIN D (ERGOCALCIFEROL) 1.25 MG (50000 UNIT) PO CAPS: 50000.0000 [IU] | ORAL_CAPSULE | ORAL | 0 refills | Status: DC

## 2019-10-14 NOTE — Telephone Encounter (Signed)
Spoke with patient and informed her. Rx sent. 

## 2019-10-18 ENCOUNTER — Other Ambulatory Visit: Payer: Self-pay | Admitting: Women's Health

## 2019-10-18 DIAGNOSIS — Z1231 Encounter for screening mammogram for malignant neoplasm of breast: Secondary | ICD-10-CM

## 2019-10-28 ENCOUNTER — Other Ambulatory Visit: Payer: Self-pay

## 2019-10-29 ENCOUNTER — Ambulatory Visit (INDEPENDENT_AMBULATORY_CARE_PROVIDER_SITE_OTHER): Payer: No Typology Code available for payment source

## 2019-10-29 ENCOUNTER — Other Ambulatory Visit: Payer: Self-pay | Admitting: Women's Health

## 2019-10-29 DIAGNOSIS — Z780 Asymptomatic menopausal state: Secondary | ICD-10-CM | POA: Diagnosis not present

## 2019-10-29 DIAGNOSIS — M8589 Other specified disorders of bone density and structure, multiple sites: Secondary | ICD-10-CM | POA: Diagnosis not present

## 2019-10-29 DIAGNOSIS — Z13820 Encounter for screening for osteoporosis: Secondary | ICD-10-CM

## 2019-10-31 ENCOUNTER — Ambulatory Visit
Admission: RE | Admit: 2019-10-31 | Discharge: 2019-10-31 | Disposition: A | Discharge status: Home / Self Care | Payer: No Typology Code available for payment source | Source: Ambulatory Visit | Attending: Women's Health | Admitting: Women's Health

## 2019-10-31 ENCOUNTER — Other Ambulatory Visit: Payer: Self-pay

## 2019-10-31 DIAGNOSIS — Z1231 Encounter for screening mammogram for malignant neoplasm of breast: Secondary | ICD-10-CM

## 2019-12-30 ENCOUNTER — Other Ambulatory Visit: Payer: Self-pay | Admitting: *Deleted

## 2020-10-29 ENCOUNTER — Other Ambulatory Visit: Payer: Self-pay | Admitting: Nurse Practitioner

## 2020-10-29 DIAGNOSIS — Z1231 Encounter for screening mammogram for malignant neoplasm of breast: Secondary | ICD-10-CM

## 2020-12-09 ENCOUNTER — Ambulatory Visit (INDEPENDENT_AMBULATORY_CARE_PROVIDER_SITE_OTHER): Payer: No Typology Code available for payment source | Admitting: Nurse Practitioner

## 2020-12-09 ENCOUNTER — Encounter: Payer: Self-pay | Admitting: Nurse Practitioner

## 2020-12-09 ENCOUNTER — Other Ambulatory Visit: Payer: Self-pay

## 2020-12-09 VITALS — BP 118/74 | Ht 63.0 in | Wt 142.0 lb

## 2020-12-09 DIAGNOSIS — Z8639 Personal history of other endocrine, nutritional and metabolic disease: Secondary | ICD-10-CM

## 2020-12-09 DIAGNOSIS — E785 Hyperlipidemia, unspecified: Secondary | ICD-10-CM

## 2020-12-09 DIAGNOSIS — Z780 Asymptomatic menopausal state: Secondary | ICD-10-CM

## 2020-12-09 DIAGNOSIS — M8589 Other specified disorders of bone density and structure, multiple sites: Secondary | ICD-10-CM | POA: Diagnosis not present

## 2020-12-09 DIAGNOSIS — Z01419 Encounter for gynecological examination (general) (routine) without abnormal findings: Secondary | ICD-10-CM

## 2020-12-09 NOTE — Progress Notes (Signed)
   Bonnie Manning [birth year] [id number]   65 y.o. G1P0001 presents for annual exam without GYN complaints. Postmenopausal - no HRT, no bleeding. Normal pap and mammogram history. History of osteopenia, vitamin D deficiency.  Gynecologic History No LMP recorded. Patient is postmenopausal.   Contraception/Family planning: post menopausal status  Health Maintenance Last Pap: 08/16/2017. Results were: normal Last mammogram: 10/31/2019. Results were: normal Last colonoscopy: 12/2018. Results were: polyps, 7-year recall Last Dexa: 10/29/2019. Results were: T-score -1.9, FRAX 10% / 1.3%  Past medical history, past surgical history, family history and social history were all reviewed and documented in the EPIC chart. Married. Retired from Insurance underwriter, Forensic psychologist for [employer]. 33 yo son, married in 2021, lives in [place].   ROS:  A ROS was performed and pertinent positives and negatives are included.  Exam:  Vitals:   12/09/20 0852  BP: 118/74  Weight: 142 lb (64.4 kg)  Height: 5\' 3"  (1.6 m)   Body mass index is 25.15 kg/m.  General appearance:  Normal Thyroid:  Symmetrical, normal in size, without palpable masses or nodularity. Respiratory  Auscultation:  Clear without wheezing or rhonchi Cardiovascular  Auscultation:  Regular rate, without rubs, murmurs or gallops  Edema/varicosities:  Not grossly evident Abdominal  Soft,nontender, without masses, guarding or rebound.  Liver/spleen:  No organomegaly noted  Hernia:  None appreciated  Skin  Inspection:  Grossly normal Breasts: Examined lying and sitting.   Right: Without masses, retractions, nipple discharge or axillary adenopathy.   Left: Without masses, retractions, nipple discharge or axillary adenopathy. Genitourinary   Inguinal/mons:  Normal without inguinal adenopathy  External genitalia:  Normal appearing vulva with no masses, tenderness, or lesions  BUS/Urethra/Skene's glands:  Normal  Vagina:  Normal  appearing with normal color and discharge, no lesions. Atrophic changes.   Cervix:  Normal appearing without discharge or lesions  Uterus:  Normal in size, shape and contour.  Midline and mobile, nontender  Adnexa/parametria:     Rt: Normal in size, without masses or tenderness.   Lt: Normal in size, without masses or tenderness.  Anus and perineum: Normal  Digital rectal exam: Normal sphincter tone without palpated masses or tenderness  Assessment/Plan:  65 y.o. G1P0001 for annual exam.   Well female exam with routine gynecological exam - Plan: CBC with Differential/Platelet, Comprehensive metabolic panel. Education provided on SBEs, importance of preventative screenings, current guidelines, high calcium diet, regular exercise, and multivitamin daily.   Postmenopausal - no HRT, no bleeding.   Osteopenia of multiple sites - Dexa 10/29/2019 T-score -1.9, FRAX 10% / 1.3%. Recommend vitamin D supplement consistently. She is very active and walks long distances daily.  Hyperlipidemia, unspecified hyperlipidemia type - Plan: Lipid panel  History of vitamin D deficiency - Plan: VITAMIN D 25 Hydroxy (Vit-D Deficiency, Fractures)  Screening for cervical cancer - Normal Pap history.  Will repeat at 5-year interval per guidelines.  Screening for breast cancer - Normal mammogram history.  Continue annual screenings.  Normal breast exam today.  Screening for colon cancer -12/2018 colonoscopy. Will repeat at GI's recommended interval.   Return in 1 year for annual.    Tamela Gammon DNP, 9:04 AM 12/09/2020

## 2020-12-09 NOTE — Patient Instructions (Signed)

## 2020-12-10 ENCOUNTER — Encounter: Payer: Self-pay | Admitting: Nurse Practitioner

## 2020-12-10 LAB — COMPREHENSIVE METABOLIC PANEL
AG Ratio: 1.9 (calc) (ref 1.0–2.5)
ALT: 26 U/L (ref 6–29)
AST: 26 U/L (ref 10–35)
Albumin: 4.7 g/dL (ref 3.6–5.1)
Alkaline phosphatase (APISO): 69 U/L (ref 37–153)
BUN: 19 mg/dL (ref 7–25)
CO2: 29 mmol/L (ref 20–32)
Calcium: 9.7 mg/dL (ref 8.6–10.4)
Chloride: 101 mmol/L (ref 98–110)
Creat: 0.81 mg/dL (ref 0.50–0.99)
Globulin: 2.5 g/dL (calc) (ref 1.9–3.7)
Glucose, Bld: 88 mg/dL (ref 65–99)
Potassium: 4.5 mmol/L (ref 3.5–5.3)
Sodium: 138 mmol/L (ref 135–146)
Total Bilirubin: 1.1 mg/dL (ref 0.2–1.2)
Total Protein: 7.2 g/dL (ref 6.1–8.1)

## 2020-12-10 LAB — CBC WITH DIFFERENTIAL/PLATELET
Absolute Monocytes: 391 cells/uL (ref 200–950)
Basophils Absolute: 32 cells/uL (ref 0–200)
Basophils Relative: 0.7 %
Eosinophils Absolute: 60 cells/uL (ref 15–500)
Eosinophils Relative: 1.3 %
HCT: 42.4 % (ref 35.0–45.0)
Hemoglobin: 13.8 g/dL (ref 11.7–15.5)
Lymphs Abs: 1145 cells/uL (ref 850–3900)
MCH: 30.6 pg (ref 27.0–33.0)
MCHC: 32.5 g/dL (ref 32.0–36.0)
MCV: 94 fL (ref 80.0–100.0)
MPV: 10.3 fL (ref 7.5–12.5)
Monocytes Relative: 8.5 %
Neutro Abs: 2972 cells/uL (ref 1500–7800)
Neutrophils Relative %: 64.6 %
Platelets: 304 10*3/uL (ref 140–400)
RBC: 4.51 10*6/uL (ref 3.80–5.10)
RDW: 11.9 % (ref 11.0–15.0)
Total Lymphocyte: 24.9 %
WBC: 4.6 10*3/uL (ref 3.8–10.8)

## 2020-12-10 LAB — LIPID PANEL
Cholesterol: 283 mg/dL — ABNORMAL HIGH (ref <200)
HDL: 127 mg/dL (ref >=50)
LDL Cholesterol (Calc): 140 mg/dL (calc) — ABNORMAL HIGH
Non-HDL Cholesterol (Calc): 156 mg/dL (calc) — ABNORMAL HIGH (ref <130)
Total CHOL/HDL Ratio: 2.2 (calc) (ref <5.0)
Triglycerides: 68 mg/dL (ref <150)

## 2020-12-10 LAB — VITAMIN D 25 HYDROXY (VIT D DEFICIENCY, FRACTURES): Vit D, 25-Hydroxy: 44 ng/mL (ref 30–100)

## 2020-12-28 ENCOUNTER — Ambulatory Visit
Admission: RE | Admit: 2020-12-28 | Discharge: 2020-12-28 | Disposition: A | Discharge status: Home / Self Care | Payer: No Typology Code available for payment source | Source: Ambulatory Visit | Attending: Nurse Practitioner | Admitting: Nurse Practitioner

## 2020-12-28 ENCOUNTER — Other Ambulatory Visit: Payer: Self-pay

## 2020-12-28 DIAGNOSIS — Z1231 Encounter for screening mammogram for malignant neoplasm of breast: Secondary | ICD-10-CM

## 2021-04-03 ENCOUNTER — Encounter (HOSPITAL_COMMUNITY): Payer: Self-pay | Admitting: *Deleted

## 2021-04-03 ENCOUNTER — Emergency Department (HOSPITAL_COMMUNITY)
Admission: EM | Admit: 2021-04-03 | Discharge: 2021-04-04 | Disposition: A | Discharge status: Home / Self Care | Payer: No Typology Code available for payment source | Attending: Emergency Medicine | Admitting: Emergency Medicine

## 2021-04-03 ENCOUNTER — Other Ambulatory Visit: Payer: Self-pay

## 2021-04-03 DIAGNOSIS — S0181XA Laceration without foreign body of other part of head, initial encounter: Secondary | ICD-10-CM | POA: Insufficient documentation

## 2021-04-03 DIAGNOSIS — W0110XA Fall on same level from slipping, tripping and stumbling with subsequent striking against unspecified object, initial encounter: Secondary | ICD-10-CM | POA: Diagnosis not present

## 2021-04-03 DIAGNOSIS — Z5321 Procedure and treatment not carried out due to patient leaving prior to being seen by health care provider: Secondary | ICD-10-CM | POA: Diagnosis not present

## 2021-04-03 DIAGNOSIS — S0990XA Unspecified injury of head, initial encounter: Secondary | ICD-10-CM | POA: Diagnosis present

## 2021-04-03 NOTE — ED Triage Notes (Signed)
The pt has a rt forehead laceration  she hasd been drinking alcohol and she tripped and fell striking her head on a planter

## 2021-04-04 ENCOUNTER — Emergency Department (HOSPITAL_BASED_OUTPATIENT_CLINIC_OR_DEPARTMENT_OTHER): Payer: No Typology Code available for payment source

## 2021-04-04 ENCOUNTER — Emergency Department (HOSPITAL_BASED_OUTPATIENT_CLINIC_OR_DEPARTMENT_OTHER)
Admission: EM | Admit: 2021-04-04 | Discharge: 2021-04-04 | Disposition: A | Discharge status: Home / Self Care | Payer: No Typology Code available for payment source | Attending: Emergency Medicine | Admitting: Emergency Medicine

## 2021-04-04 DIAGNOSIS — S0990XA Unspecified injury of head, initial encounter: Secondary | ICD-10-CM | POA: Diagnosis present

## 2021-04-04 DIAGNOSIS — S01111A Laceration without foreign body of right eyelid and periocular area, initial encounter: Secondary | ICD-10-CM | POA: Insufficient documentation

## 2021-04-04 DIAGNOSIS — S0181XA Laceration without foreign body of other part of head, initial encounter: Secondary | ICD-10-CM | POA: Insufficient documentation

## 2021-04-04 DIAGNOSIS — W01198A Fall on same level from slipping, tripping and stumbling with subsequent striking against other object, initial encounter: Secondary | ICD-10-CM | POA: Diagnosis not present

## 2021-04-04 MED ORDER — LIDOCAINE-EPINEPHRINE-TETRACAINE (LET) TOPICAL GEL
3.0000 mL | Freq: Once | TOPICAL | Status: AC
  Administered 2021-04-04: 3 mL via TOPICAL
  Filled 2021-04-04: qty 3

## 2021-04-04 MED ORDER — LIDOCAINE-EPINEPHRINE (PF) 2 %-1:200000 IJ SOLN
20.0000 mL | Freq: Once | INTRAMUSCULAR | Status: AC
  Administered 2021-04-04: 20 mL via INTRADERMAL
  Filled 2021-04-04: qty 20

## 2021-04-04 NOTE — Discharge Instructions (Signed)
5 sutures were placed today.  These will need to be removed in approximately 1 week.  To minimize scarring, avoid direct sunlight to the area for the next several weeks.  If area becomes increasingly swollen, painful, or begins draining pus, please return to the emergency department.

## 2021-04-04 NOTE — ED Provider Notes (Signed)
[facility] EMERGENCY DEPT Provider Note   CSN: [id number] Arrival date & time: 04/04/21  2671     History Chief Complaint  Patient presents with   Fall   Laceration    Bonnie Manning is a 66 y.o. female.   Fall Pertinent negatives include no chest pain, no abdominal pain, no headaches and no shortness of breath.  Laceration Associated symptoms: no fever and no rash   Patient is a 65 year old female who presents after a fall and facial laceration.  This occurred at home approximately 11 hours ago.  Patient states that she was drinking wine and lost her balance.  She is uncertain of the details of the fall.  She is uncertain if she lost consciousness.  She was found awake, laying on the deck by her husband.  He suspects that she struck her head on a metal planter box.  Both patient and husband deny large-volume blood loss.  They were not able to perform washout at home.  Their neighbor helped him place a bandage wrap.  They subsequently went to [facility] ED.  Due to the prolonged wait time, they left.  No work-up was obtained at [facility].  Patient denies any chronic medical conditions.  She does not take any daily medications, including blood thinners.  She denies any current symptoms.  She denies any significant pain or discomfort.  She does state that her last tetanus shot was in February of this year.    Past Medical History:  Diagnosis Date   Hx of adenomatous rectal polyps 12/26/2018    Patient Active Problem List   Diagnosis Date Noted   Hx of adenomatous rectal polyps 12/26/2018   Osteopenia after menopause 08/27/2018    Past Surgical History:  Procedure Laterality Date   CESAREAN SECTION     [place]ONOS[place]  2009   in [place]normal exam"   WISDOM TOOTH EXTRACTION       OB History     Gravida  1   Para      Term      Preterm      AB  0   Living  1      SAB      IAB      Ectopic  0   Multiple      Live Births               Family History  Problem Relation Age of Onset   Alzheimer's disease Mother    Bladder Cancer Father    Ovarian cancer Sister    Colon polyps Sister    Colon cancer Paternal Aunt    Esophageal cancer Neg Hx    Rectal cancer Neg Hx    Stomach cancer Neg Hx     Social History   Tobacco Use   Smoking status: Never   Smokeless tobacco: Never  Vaping Use   Vaping Use: Never used  Substance Use Topics   Alcohol use: Yes    Alcohol/week: 6.0 standard drinks    Types: 6 Glasses of wine per week   Drug use: No    Home Medications Prior to Admission medications   Medication Sig Start Date End Date Taking? Authorizing Provider  Red Yeast Rice Extract (RED YEAST RICE PO) Take by mouth.    [provider]  VITAMIN D PO Take by mouth.    [provider]    Allergies    Patient has no known allergies.  Review of Systems  Review of Systems  Constitutional:  Negative for chills and fever.  HENT:  Negative for ear pain and sore throat.   Eyes:  Negative for pain and visual disturbance.  Respiratory:  Negative for cough and shortness of breath.   Cardiovascular:  Negative for chest pain and palpitations.  Gastrointestinal:  Negative for abdominal pain, nausea and vomiting.  Genitourinary:  Negative for dysuria, flank pain, hematuria and pelvic pain.  Musculoskeletal:  Negative for arthralgias, back pain, myalgias and neck pain.  Skin:  Positive for wound. Negative for color change and rash.  Neurological:  Negative for dizziness, seizures, syncope, weakness, light-headedness, numbness and headaches.  Hematological:  Does not bruise/bleed easily.  Psychiatric/Behavioral:  Negative for confusion and decreased concentration.   All other systems reviewed and are negative.  Physical Exam Updated Vital Signs BP 137/84   Pulse 86   Temp 98.2 F (36.8 C)   Resp 18   Ht 5\' 3"  (1.6 m)   Wt 63.5 kg   SpO2 99%   BMI 24.80 kg/m   Physical Exam Vitals and  nursing note reviewed.  Constitutional:      General: She is not in acute distress.    Appearance: Normal appearance. She is well-developed and normal weight. She is not ill-appearing, toxic-appearing or diaphoretic.  HENT:     Head:     Comments: Laceration to right side of forehead.  Hemostatic.    Right Ear: External ear normal.     Left Ear: External ear normal.     Nose: Nose normal.     Mouth/Throat:     Mouth: Mucous membranes are moist.     Pharynx: Oropharynx is clear.  Eyes:     Extraocular Movements: Extraocular movements intact.     Conjunctiva/sclera: Conjunctivae normal.  Cardiovascular:     Rate and Rhythm: Normal rate and regular rhythm.     Heart sounds: No murmur heard. Pulmonary:     Effort: Pulmonary effort is normal. No respiratory distress.     Breath sounds: Normal breath sounds.  Abdominal:     General: Abdomen is flat.     Palpations: Abdomen is soft.  Musculoskeletal:        General: No deformity. Normal range of motion.     Cervical back: Normal range of motion and neck supple. No rigidity.     Right lower leg: No edema.     Left lower leg: No edema.  Skin:    General: Skin is warm and dry.     Coloration: Skin is not jaundiced or pale.  Neurological:     General: No focal deficit present.     Mental Status: She is alert and oriented to person, place, and time.     Cranial Nerves: No cranial nerve deficit.     Sensory: No sensory deficit.     Motor: No weakness.  Psychiatric:        Mood and Affect: Mood normal.        Behavior: Behavior normal.        Thought Content: Thought content normal.        Judgment: Judgment normal.     ED Results / Procedures / Treatments   Labs (all labs ordered are listed, but only abnormal results are displayed) Labs Reviewed - No data to display  EKG None  Radiology CT Head Wo Contrast  Result Date: 04/04/2021 CLINICAL DATA:  Pain after trauma/fall. EXAM: CT HEAD WITHOUT CONTRAST CT CERVICAL SPINE  WITHOUT CONTRAST TECHNIQUE:  Multidetector CT imaging of the head and cervical spine was performed following the standard protocol without intravenous contrast. Multiplanar CT image reconstructions of the cervical spine were also generated. COMPARISON:  None. FINDINGS: CT HEAD FINDINGS Brain: No evidence of acute infarction, hemorrhage, hydrocephalus, extra-axial collection or mass lesion/mass effect. Vascular: No hyperdense vessel or unexpected calcification. Skull: Normal. Negative for fracture or focal lesion. Sinuses/Orbits: No acute finding. Other: Laceration over the low right forehead. CT CERVICAL SPINE FINDINGS Alignment: Normal. Skull base and vertebrae: No acute fracture. No primary bone lesion or focal pathologic process. Soft tissues and spinal canal: No prevertebral fluid or swelling. No visible canal hematoma. Disc levels:  Multilevel degenerative disc disease. Upper chest: Negative. Other: No other abnormalities. IMPRESSION: 1. Laceration over the right supraorbital region. 2. No acute intracranial abnormalities. 3. No fracture or traumatic malalignment in the cervical spine. Electronically Signed   By: Dorise Bullion III M.D.   On: 04/04/2021 07:58   CT Cervical Spine Wo Contrast  Result Date: 04/04/2021 CLINICAL DATA:  Pain after trauma/fall. EXAM: CT HEAD WITHOUT CONTRAST CT CERVICAL SPINE WITHOUT CONTRAST TECHNIQUE: Multidetector CT imaging of the head and cervical spine was performed following the standard protocol without intravenous contrast. Multiplanar CT image reconstructions of the cervical spine were also generated. COMPARISON:  None. FINDINGS: CT HEAD FINDINGS Brain: No evidence of acute infarction, hemorrhage, hydrocephalus, extra-axial collection or mass lesion/mass effect. Vascular: No hyperdense vessel or unexpected calcification. Skull: Normal. Negative for fracture or focal lesion. Sinuses/Orbits: No acute finding. Other: Laceration over the low right forehead. CT CERVICAL SPINE  FINDINGS Alignment: Normal. Skull base and vertebrae: No acute fracture. No primary bone lesion or focal pathologic process. Soft tissues and spinal canal: No prevertebral fluid or swelling. No visible canal hematoma. Disc levels:  Multilevel degenerative disc disease. Upper chest: Negative. Other: No other abnormalities. IMPRESSION: 1. Laceration over the right supraorbital region. 2. No acute intracranial abnormalities. 3. No fracture or traumatic malalignment in the cervical spine. Electronically Signed   By: Dorise Bullion III M.D.   On: 04/04/2021 07:58    Procedures .Marland KitchenLaceration Repair  Date/Time: 04/04/2021 8:16 AM Performed by: Godfrey Pick, MD Authorized by: Godfrey Pick, MD   Consent:    Consent obtained:  Verbal   Consent given by:  Patient   Risks, benefits, and alternatives were discussed: yes     Risks discussed:  Infection, pain, poor cosmetic result and poor wound healing   Alternatives discussed:  No treatment Universal protocol:    Procedure explained and questions answered to patient or proxy's satisfaction: yes     Imaging studies available: yes     Patient identity confirmed:  Verbally with patient Anesthesia:    Anesthesia method:  Topical application and local infiltration   Topical anesthetic:  LET   Local anesthetic:  Lidocaine 2% WITH epi Laceration details:    Location:  Face   Face location:  Forehead   Length (cm):  3   Depth (mm):  5 Exploration:    Imaging obtained comment:  CTH   Imaging outcome: foreign body not noted     Wound exploration: wound explored through full range of motion and entire depth of wound visualized     Contaminated: no   Treatment:    Area cleansed with:  Saline   Amount of cleaning:  Standard   Irrigation solution:  Sterile saline   Irrigation volume:  500   Irrigation method:  Syringe   Debridement:  None   Undermining:  None Skin repair:    Repair method:  Sutures   Suture size:  5-0   Suture material:  Nylon    Suture technique:  Simple interrupted   Number of sutures:  5 Approximation:    Approximation:  Close Repair type:    Repair type:  Simple Post-procedure details:    Dressing:  Non-adherent dressing and antibiotic ointment   Procedure completion:  Tolerated well, no immediate complications   Medications Ordered in ED Medications  lidocaine-EPINEPHrine-tetracaine (LET) topical gel (3 mLs Topical Given 04/04/21 0720)  lidocaine-EPINEPHrine (XYLOCAINE W/EPI) 2 %-1:200000 (PF) injection 20 mL (20 mLs Intradermal Given 04/04/21 8299)    ED Course  I have reviewed the triage vital signs and the nursing notes.  Pertinent labs & imaging results that were available during my care of the patient were reviewed by me and considered in my medical decision making (see chart for details).    MDM Rules/Calculators/A&P                           Patient presents for fall and subsequent laceration to right side of forehead.  She reports good underlying health.  She has not had any concerning symptoms since the fall.  On arrival in the ED, wound is hemostatic.  Estimated time of fall was 11 hours prior to arrival.  Given the vague details surrounding the fall, will obtain CT scan of head and cervical spine.  Let gel was ordered.  Given normal vital signs and report of minimal blood loss, will not obtain lab work.  Patient does report being up-to-date on tetanus.  CT scan of head and cervical spine notable only for forehead laceration.  Wound was repaired, as per procedure note above.  Patient tolerated this well.  She was advised to have sutures removed in approximately 1 week and to avoid direct sunlight to minimize scarring.  Return precautions given in event of signs of infection.  Patient was discharged in good condition.  Final Clinical Impression(s) / ED Diagnoses Final diagnoses:  Laceration of eyebrow and forehead, right, initial encounter    Rx / DC Orders ED Discharge Orders     None         Godfrey Pick, MD 04/04/21 [phone]

## 2021-04-04 NOTE — ED Triage Notes (Signed)
PT to ED from home with c/o fall when PT tripped and struck her deck head first. Pt cannot deny LOC stating she cannot exactly remember what took place immediately after the fall. PT denies blood thinners and dizziness, nausea, photophobia, etc.

## 2021-04-04 NOTE — ED Notes (Signed)
PT left AMA 

## 2021-04-04 NOTE — ED Notes (Signed)
Lidocaine gel applied to right forehead laceration.

## 2021-04-13 ENCOUNTER — Encounter (HOSPITAL_BASED_OUTPATIENT_CLINIC_OR_DEPARTMENT_OTHER): Payer: Self-pay | Admitting: Emergency Medicine

## 2021-04-13 ENCOUNTER — Other Ambulatory Visit: Payer: Self-pay

## 2021-04-13 ENCOUNTER — Emergency Department (HOSPITAL_BASED_OUTPATIENT_CLINIC_OR_DEPARTMENT_OTHER)
Admission: EM | Admit: 2021-04-13 | Discharge: 2021-04-13 | Disposition: A | Discharge status: Home / Self Care | Payer: No Typology Code available for payment source | Attending: Emergency Medicine | Admitting: Emergency Medicine

## 2021-04-13 DIAGNOSIS — Z4802 Encounter for removal of sutures: Secondary | ICD-10-CM | POA: Diagnosis not present

## 2021-04-13 NOTE — ED Triage Notes (Signed)
Pt arrives to ED to have her stiches removed.

## 2021-04-13 NOTE — ED Provider Notes (Signed)
[facility] EMERGENC[facility] DEPT Provider Note   CSN: [id number] Arrival date & time: 04/13/21  4098     History Chief Complaint  Patient presents with   Suture / Staple Removal    Bonnie Manning is a 65 y.o. female.  HPI 65 year old female presents today for suture removal after laceration to head on October 2.  She reports no problems with healing.  She has been putting Neosporin ointment on it.     Past Medical History:  Diagnosis Date   Hx of adenomatous rectal polyps 12/26/2018    Patient Active Problem List   Diagnosis Date Noted   Hx of adenomatous rectal polyps 12/26/2018   Osteopenia after menopause 08/27/2018    Past Surgical History:  Procedure Laterality Date   CESAREAN SECTION     COLONOSCOPY  2009   in [place]-"normal exam"   WISDOM TOOTH EXTRACTION       OB History     Gravida  1   Para      Term      Preterm      AB  0   Living  1      SAB      IAB      Ectopic  0   Multiple      Live Births              Family History  Problem Relation Age of Onset   Alzheimer's disease Mother    Bladder Cancer Father    Ovarian cancer Sister    Colon polyps Sister    Colon cancer Paternal Aunt    Esophageal cancer Neg Hx    Rectal cancer Neg Hx    Stomach cancer Neg Hx     Social History   Tobacco Use   Smoking status: Never   Smokeless tobacco: Never  Vaping Use   Vaping Use: Never used  Substance Use Topics   Alcohol use: Yes    Alcohol/week: 6.0 standard drinks    Types: 6 Glasses of wine per week   Drug use: No    Home Medications Prior to Admission medications   Medication Sig Start Date End Date Taking? Authorizing Provider  Red Yeast Rice Extract (RED YEAST RICE PO) Take by mouth.    [provider]  VITAMIN D PO Take by mouth.    [provider]    Allergies    Patient has no known allergies.  Review of Systems   Review of Systems  All other systems reviewed and are  negative.  Physical Exam Updated Vital Signs Ht 1.6 m (5\' 3" )   Wt 63.5 kg   BMI 24.80 kg/m   Physical Exam Vitals and nursing note reviewed.  Constitutional:      General: She is not in acute distress.    Appearance: She is well-developed.  HENT:     Head: Normocephalic and atraumatic.     Comments: 5 cm laceration right forehead that appears to be well-healing with 5 sutures intact.    Right Ear: External ear normal.     Left Ear: External ear normal.     Nose: Nose normal.  Eyes:     Extraocular Movements: Extraocular movements intact.     Conjunctiva/sclera: Conjunctivae normal.     Pupils: Pupils are equal, round, and reactive to light.  Pulmonary:     Effort: Pulmonary effort is normal.  Musculoskeletal:        General: Normal range of motion.  Cervical back: Normal range of motion and neck supple.  Skin:    General: Skin is warm and dry.  Neurological:     Mental Status: She is alert and oriented to person, place, and time.     Motor: No abnormal muscle tone.     Coordination: Coordination normal.  Psychiatric:        Behavior: Behavior normal.        Thought Content: Thought content normal.    ED Results / Procedures / Treatments   Labs (all labs ordered are listed, but only abnormal results are displayed) Labs Reviewed - No data to display  EKG None  Radiology No results found.  Procedures Procedures   Medications Ordered in ED Medications - No data to display  ED Course  I have reviewed the triage vital signs and the nursing notes.  Pertinent labs & imaging results that were available during my care of the patient were reviewed by me and considered in my medical decision making (see chart for details).    MDM Rules/Calculators/A&P                           Well-healing wound.  RN to remove sutures.  Discussed further wound care and return precautions patient voices understanding. Final Clinical Impression(s) / ED Diagnoses Final  diagnoses:  Visit for suture removal    Rx / DC Orders ED Discharge Orders     None        Pattricia Boss, MD 04/13/21 [phone]

## 2021-04-13 NOTE — ED Notes (Signed)
Removed 5 intact sutures from area above rt eye, incision well intact without s/s of infection. Pt verbalizes understanding to massage in small circles with light pressure a few times a day to decrease scarring.

## 2021-04-13 NOTE — Discharge Instructions (Addendum)
Return for reevaluation if you have any increased redness, pain, swelling, or discharge Once this has healed and you are going out in the sun again make sure that you use sun protection. Your blood pressure here was elevated at 152/95.  This is not unusually high in the emergency department.  However, you should have this rechecked by your primary care physician less stressful situation.

## 2021-12-14 ENCOUNTER — Other Ambulatory Visit: Payer: Self-pay | Admitting: Nurse Practitioner

## 2021-12-14 DIAGNOSIS — Z1231 Encounter for screening mammogram for malignant neoplasm of breast: Secondary | ICD-10-CM

## 2022-01-11 ENCOUNTER — Ambulatory Visit
Admission: RE | Admit: 2022-01-11 | Discharge: 2022-01-11 | Disposition: A | Discharge status: Home / Self Care | Payer: Medicare Other | Source: Ambulatory Visit | Attending: Nurse Practitioner | Admitting: Nurse Practitioner

## 2022-01-11 ENCOUNTER — Encounter: Payer: Self-pay | Admitting: Nurse Practitioner

## 2022-01-11 ENCOUNTER — Ambulatory Visit (INDEPENDENT_AMBULATORY_CARE_PROVIDER_SITE_OTHER): Payer: Medicare Other | Admitting: Nurse Practitioner

## 2022-01-11 ENCOUNTER — Other Ambulatory Visit (HOSPITAL_COMMUNITY)
Admission: RE | Admit: 2022-01-11 | Discharge: 2022-01-11 | Disposition: A | Discharge status: Home / Self Care | Payer: Medicare Other | Source: Ambulatory Visit | Attending: Nurse Practitioner | Admitting: Nurse Practitioner

## 2022-01-11 VITALS — BP 118/82 | HR 74 | Resp 16 | Ht 63.0 in | Wt 145.0 lb

## 2022-01-11 DIAGNOSIS — Z1231 Encounter for screening mammogram for malignant neoplasm of breast: Secondary | ICD-10-CM

## 2022-01-11 DIAGNOSIS — Z780 Asymptomatic menopausal state: Secondary | ICD-10-CM

## 2022-01-11 DIAGNOSIS — Z01419 Encounter for gynecological examination (general) (routine) without abnormal findings: Secondary | ICD-10-CM | POA: Diagnosis present

## 2022-01-11 DIAGNOSIS — Z1151 Encounter for screening for human papillomavirus (HPV): Secondary | ICD-10-CM | POA: Insufficient documentation

## 2022-01-11 DIAGNOSIS — M8589 Other specified disorders of bone density and structure, multiple sites: Secondary | ICD-10-CM

## 2022-01-11 NOTE — Progress Notes (Signed)
Bonnie Manning 1956-01-08 [id number]   History:  66 y.o. G1P0001 presents for breast and pelvic exam without GYN complaints. Postmenopausal - no HRT, no bleeding. Normal pap and mammogram history. History of osteopenia, vitamin D deficiency.  Gynecologic History No LMP recorded. Patient is postmenopausal.   Contraception/Family planning: post menopausal status Sexually active: Yes  Health Maintenance Last Pap: 08/16/2017. Results were: Normal Last mammogram: 01/11/2022. Results were: No report yet Last colonoscopy: 12/19/2018. Results were: Polyps, 7-year recall Last Dexa: 10/29/2019. Results were: T-score -1.9, FRAX 10% / 1.3%  Past medical history, past surgical history, family history and social history were all reviewed and documented in the EPIC chart. Married. Retired from Insurance underwriter, Forensic psychologist for [employer]. 43 yo son, lives in [place], has 3 mo daughter Ovid Curd, moving to [place].  ROS:  A ROS was performed and pertinent positives and negatives are included.  Exam:  Vitals:   01/11/22 1332  BP: 118/82  Pulse: 74  Resp: 16  Weight: 145 lb (65.8 kg)  Height: '5\' 3"'$  (1.6 m)    Body mass index is 25.69 kg/m.  General appearance:  Normal Thyroid:  Symmetrical, normal in size, without palpable masses or nodularity. Respiratory  Auscultation:  Clear without wheezing or rhonchi Cardiovascular  Auscultation:  Regular rate, without rubs, murmurs or gallops  Edema/varicosities:  Not grossly evident Abdominal  Soft,nontender, without masses, guarding or rebound.  Liver/spleen:  No organomegaly noted  Hernia:  None appreciated  Skin  Inspection:  Grossly normal Breasts: Examined lying and sitting.   Right: Without masses, retractions, nipple discharge or axillary adenopathy.   Left: Without masses, retractions, nipple discharge or axillary adenopathy. Genitourinary   Inguinal/mons:  Normal without inguinal adenopathy  External genitalia:  Normal appearing vulva with  no masses, tenderness, or lesions  BUS/Urethra/Skene's glands:  Normal  Vagina:  Normal appearing with normal color and discharge, no lesions. Atrophic changes  Cervix:  Normal appearing without discharge or lesions  Uterus:  Normal in size, shape and contour.  Midline and mobile, nontender  Adnexa/parametria:     Rt: Normal in size, without masses or tenderness.   Lt: Normal in size, without masses or tenderness.  Anus and perineum: Normal  Digital rectal exam: Normal sphincter tone without palpated masses or tenderness  Patient informed chaperone available to be present for breast and pelvic exam. Patient has requested no chaperone to be present. Patient has been advised what will be completed during breast and pelvic exam.   Assessment/Plan:  66 y.o. G1P0001 for breast and pelvic exam.   Well female exam with routine gynecological exam - Plan: Cytology - PAP( [facility]). Education provided on SBEs, importance of preventative screenings, current guidelines, high calcium diet, regular exercise, and multivitamin daily. Plans to establish with PCP and will have labs done there d/t Medicare requirements.   Postmenopausal - Plan: DG Bone Density. No HRT, no bleeding.   Osteopenia of multiple sites - Plan: DG Bone Density.  Dexa 10/29/2019 T-score -1.9, FRAX 10% / 1.3%. Recommend vitamin D supplement consistently. She is very active and walks long distances daily. Will repeat DXA now.   Screening for cervical cancer - Normal Pap history. Pap today. If normal we can DC screenings per guidelines and she is agreeable.   Screening for breast cancer - Normal mammogram history.  Continue annual screenings, mammogram today. Normal breast exam today.  Screening for colon cancer - 12/2018 colonoscopy. Will repeat at 7-year interval per GI's recommendation.  Return in 2 years for  breast and pelvic exam.    Tamela Gammon DNP, 1:56 PM 01/11/2022

## 2022-01-13 LAB — CYTOLOGY - PAP
Comment: NEGATIVE
Diagnosis: NEGATIVE
High risk HPV: NEGATIVE

## 2022-02-08 ENCOUNTER — Ambulatory Visit (INDEPENDENT_AMBULATORY_CARE_PROVIDER_SITE_OTHER): Payer: Medicare Other

## 2022-02-08 ENCOUNTER — Other Ambulatory Visit: Payer: Self-pay | Admitting: Nurse Practitioner

## 2022-02-08 DIAGNOSIS — Z780 Asymptomatic menopausal state: Secondary | ICD-10-CM

## 2022-02-08 DIAGNOSIS — Z13820 Encounter for screening for osteoporosis: Secondary | ICD-10-CM | POA: Diagnosis not present

## 2022-02-08 DIAGNOSIS — M8589 Other specified disorders of bone density and structure, multiple sites: Secondary | ICD-10-CM

## 2022-10-15 IMAGING — CT CT CERVICAL SPINE W/O CM
4 series · 15 of 33 positions shown, 18 images · non-contrast
Comparison: None.

CLINICAL DATA: Pain after trauma/fall.

EXAM:
CT HEAD WITHOUT CONTRAST
CT CERVICAL SPINE WITHOUT CONTRAST
TECHNIQUE: Multidetector CT imaging of the head and cervical spine was
performed following the standard protocol without intravenous
contrast. Multiplanar CT image reconstructions of the cervical spine
were also generated.

[Series 4: c spine soft · axial · 0.35mm/px · z∈[-290,-264]mm · 2 of 77 slices shown]
[im 13/77  soft-tissue]
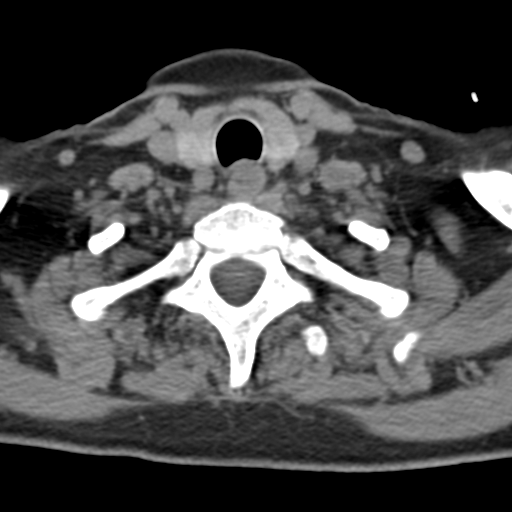
[im 26/77  soft-tissue]
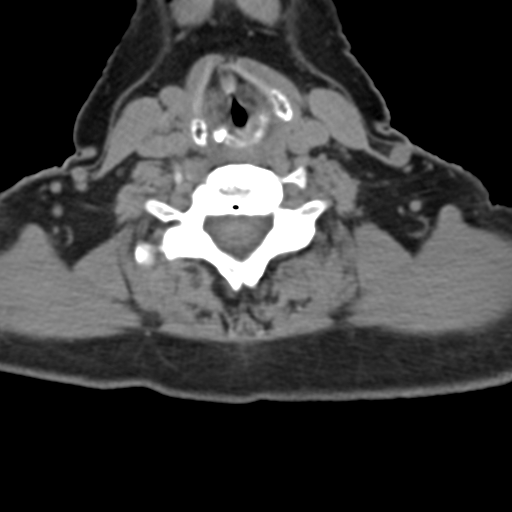

[Series 5: cor bone · coronal · 0.34mm/px · 3 of 46 slices shown]
[im 10/46  bone]
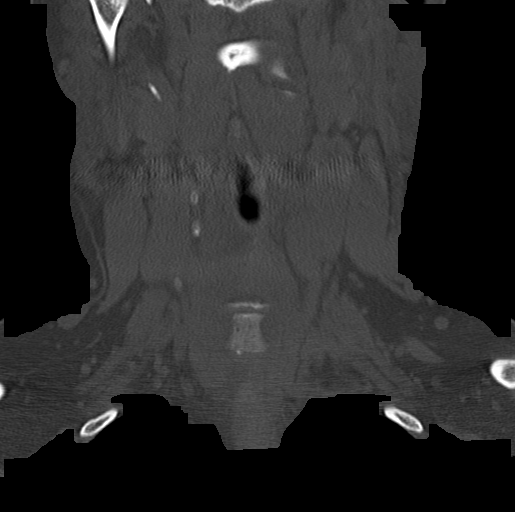
[im 19/46  bone]
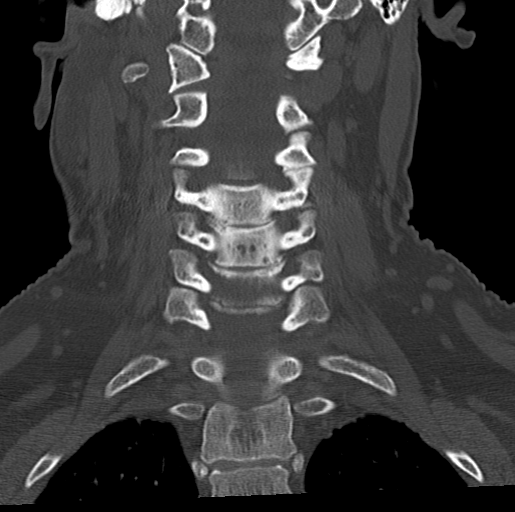
[im 28/46  bone]
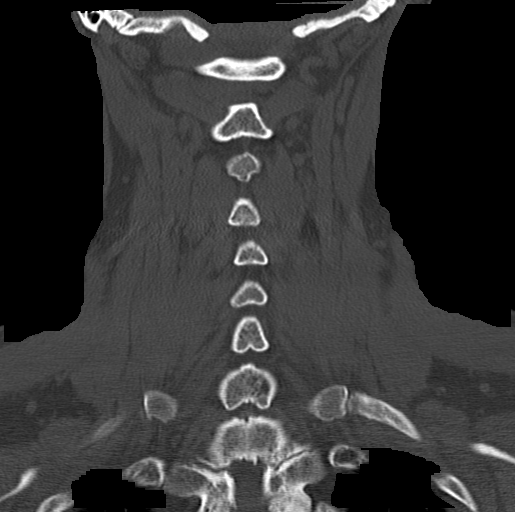

[Series 6: sag bone · sagittal · 0.31mm/px · 5 of 61 slices shown, 6 images]
[im 21/61  bone]
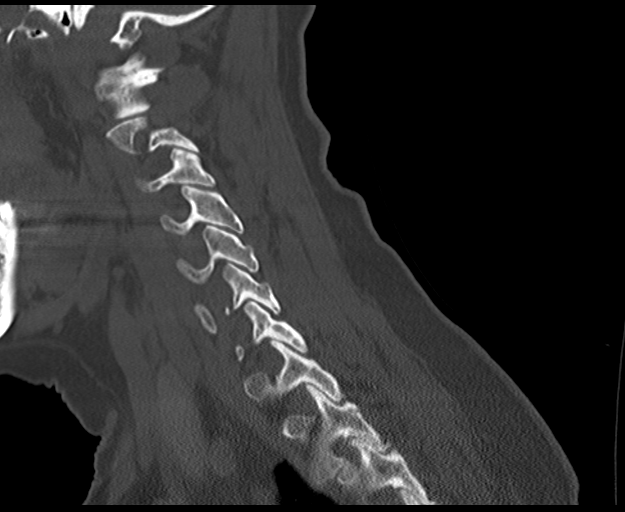
[im 26/61  bone]
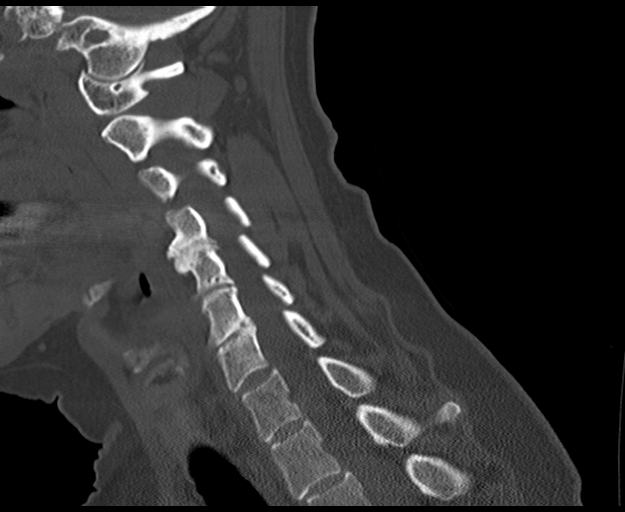
[im 31/61  soft-tissue]
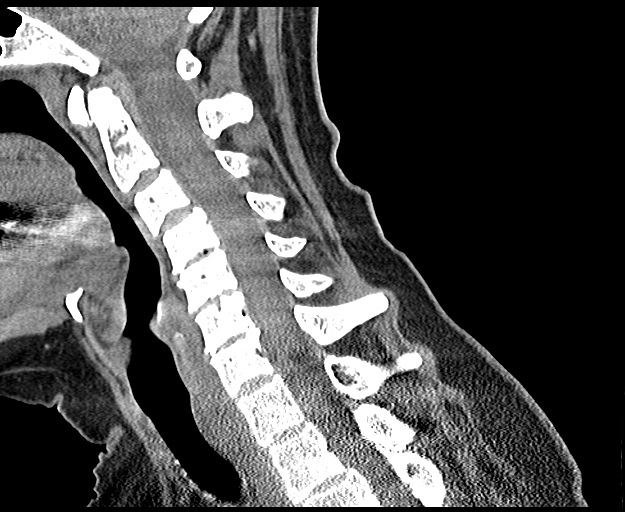
[im 31/61  bone]
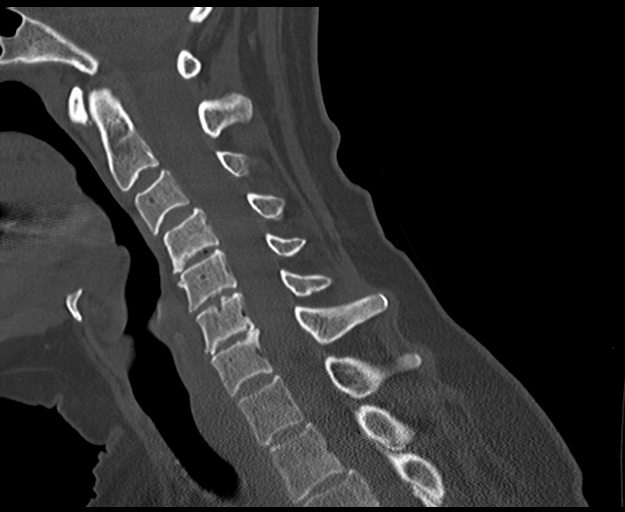
[im 36/61  bone]
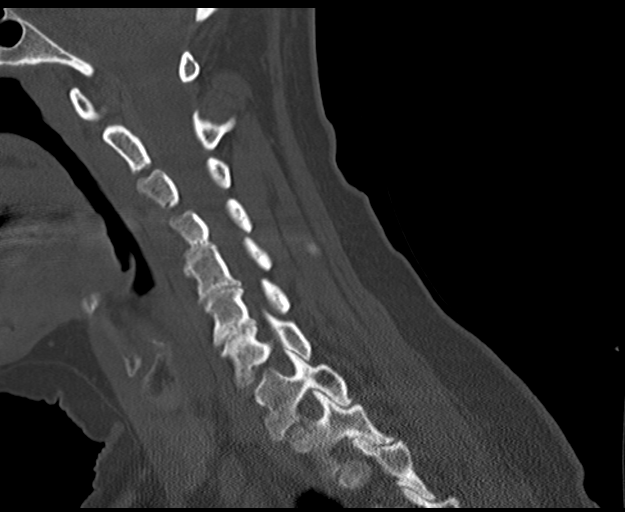
[im 41/61  bone]
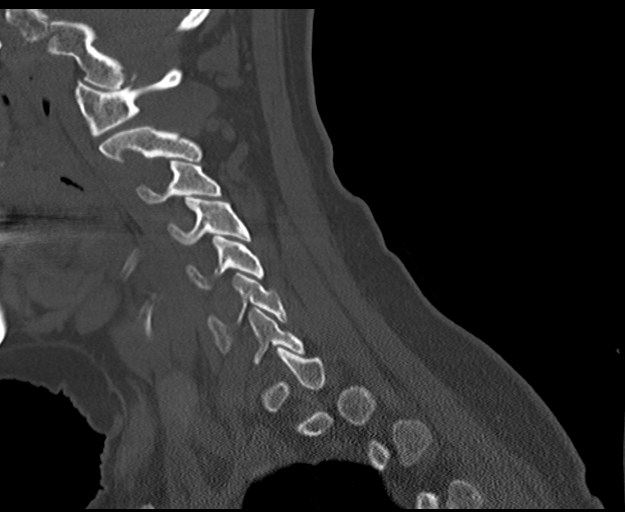

[Series 7: orthogonal axials · axial · 0.21mm/px · z∈[-299,-209]mm · 5 of 84 slices shown, 7 images]
[im 14/84  soft-tissue]
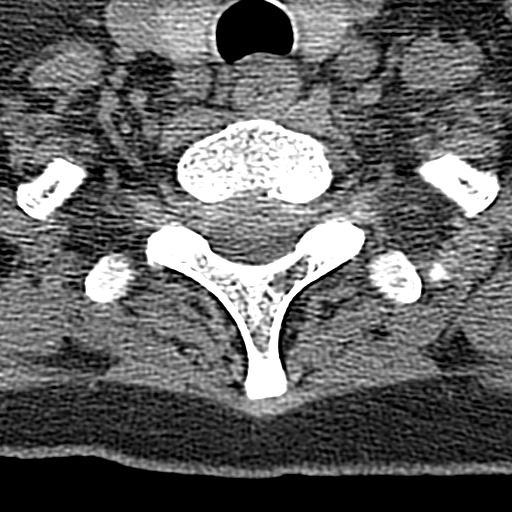
[im 14/84  bone]
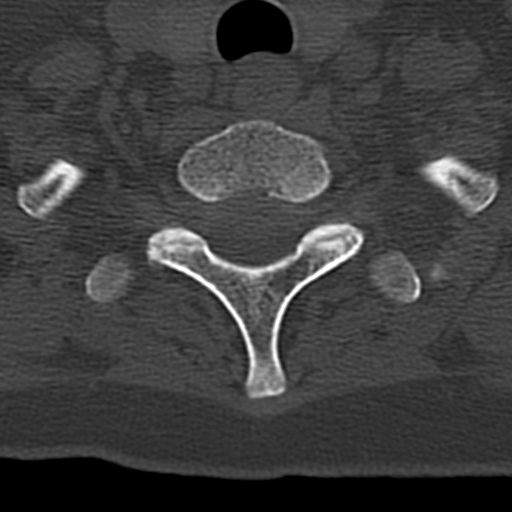
[im 28/84  bone]
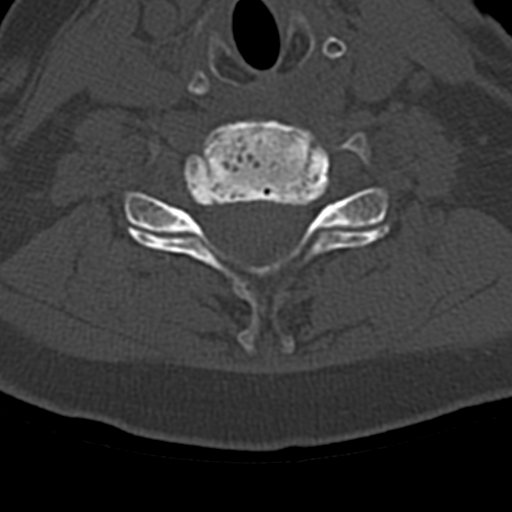
[im 42/84  bone]
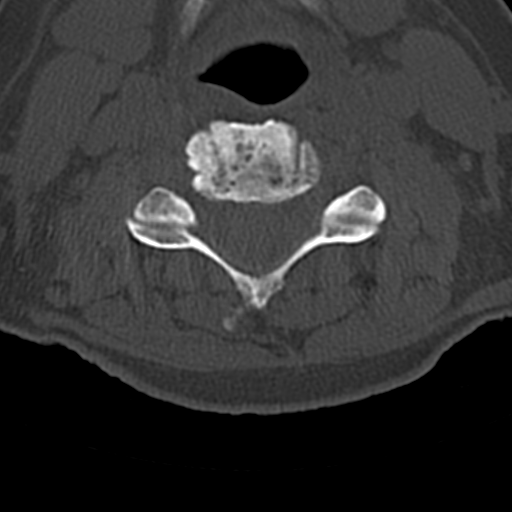
[im 56/84  bone]
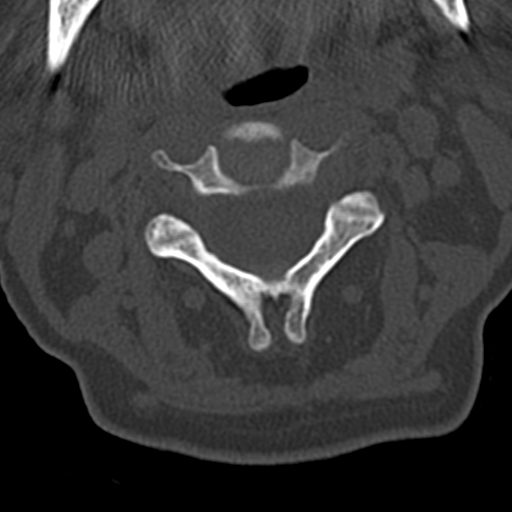
[im 70/84  soft-tissue]
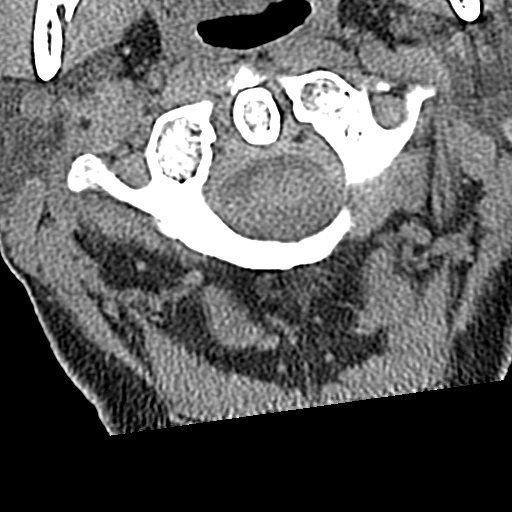
[im 70/84  bone]
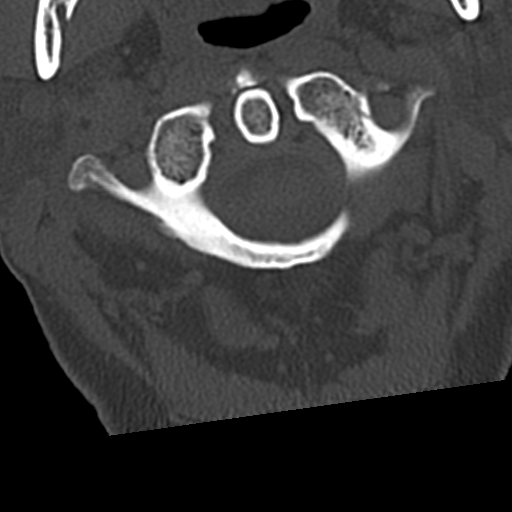

[15 of 33 positions shown; findings below may reference images not displayed]

FINDINGS: CT HEAD FINDINGS

Brain: No evidence of acute infarction, hemorrhage, hydrocephalus,
extra-axial collection or mass lesion/mass effect.

Vascular: No hyperdense vessel or unexpected calcification.

Skull: Normal. Negative for fracture or focal lesion.

Sinuses/Orbits: No acute finding.

Other: Laceration over the low right forehead.

CT CERVICAL SPINE FINDINGS

Alignment: Normal.

Skull base and vertebrae: No acute fracture. No primary bone lesion
or focal pathologic process.

Soft tissues and spinal canal: No prevertebral fluid or swelling. No
visible canal hematoma.

Disc levels:  Multilevel degenerative disc disease.

Upper chest: Negative.

Other: No other abnormalities.
IMPRESSION: 1. Laceration over the right supraorbital region.
2. No acute intracranial abnormalities.
3. No fracture or traumatic malalignment in the cervical spine.

## 2022-10-15 IMAGING — CT CT HEAD W/O CM
4 series · 16 of 47 positions shown, 18 images · non-contrast
Comparison: None.

CLINICAL DATA: Pain after trauma/fall.

EXAM:
CT HEAD WITHOUT CONTRAST
CT CERVICAL SPINE WITHOUT CONTRAST
TECHNIQUE: Multidetector CT imaging of the head and cervical spine was
performed following the standard protocol without intravenous
contrast. Multiplanar CT image reconstructions of the cervical spine
were also generated.

[Series 2: head wo · axial · 0.42mm/px · z∈[-163,-53]mm · 7 of 30 slices shown, 9 images]
[im 4/30  brain]
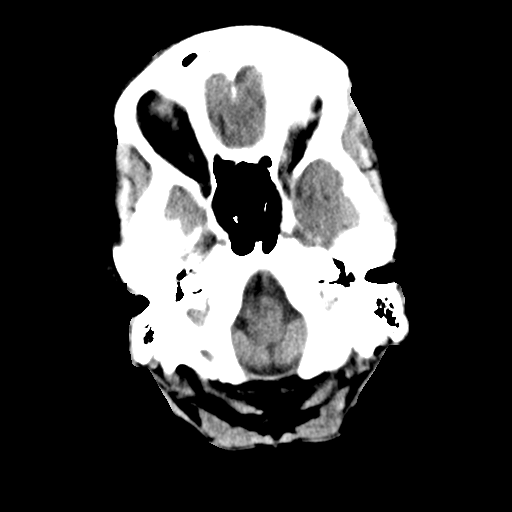
[im 4/30  bone]
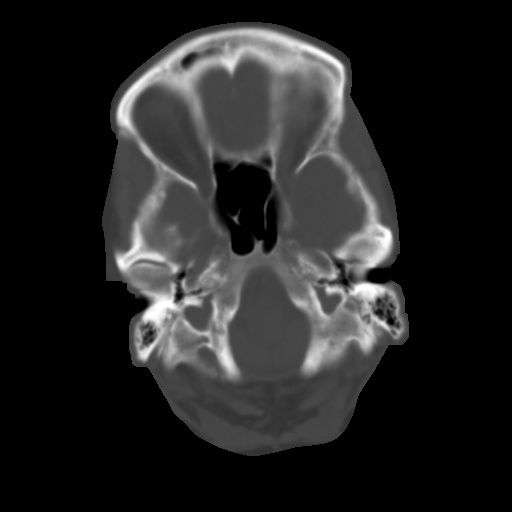
[im 8/30  brain]
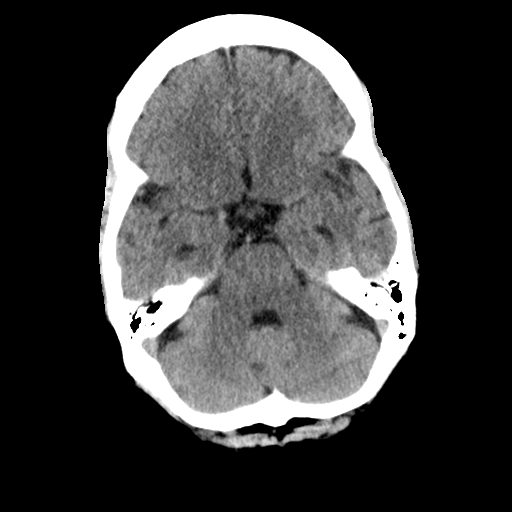
[im 11/30  brain]
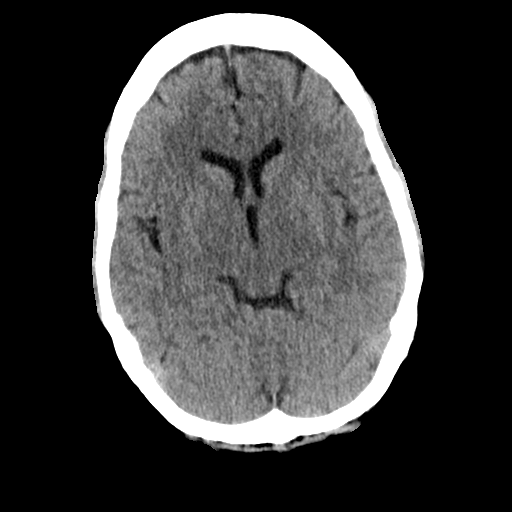
[im 15/30  brain]
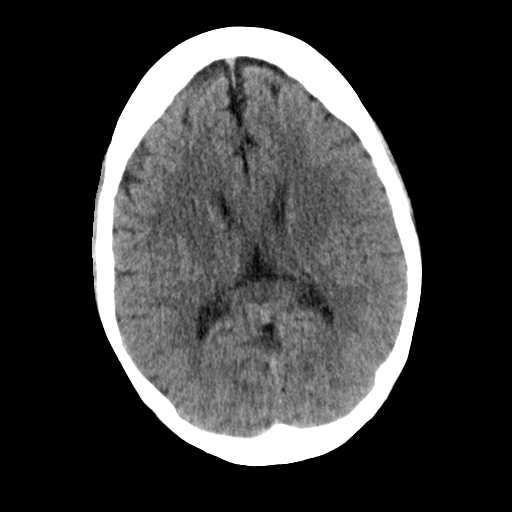
[im 19/30  brain]
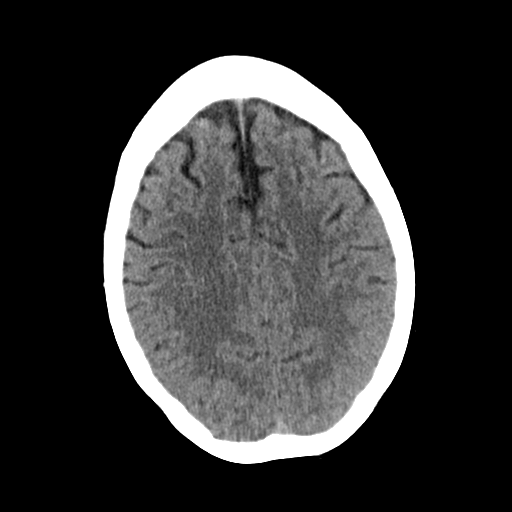
[im 19/30  bone]
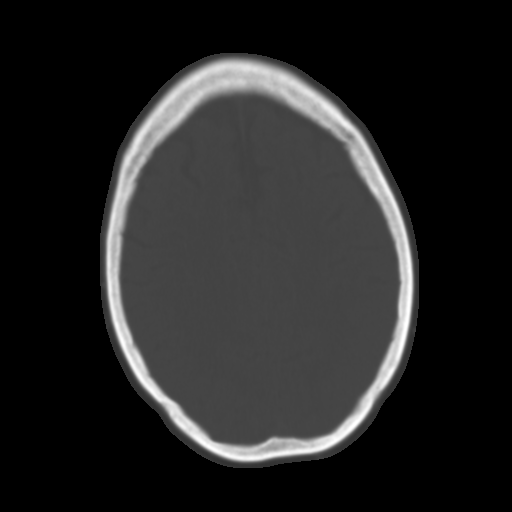
[im 22/30  brain]
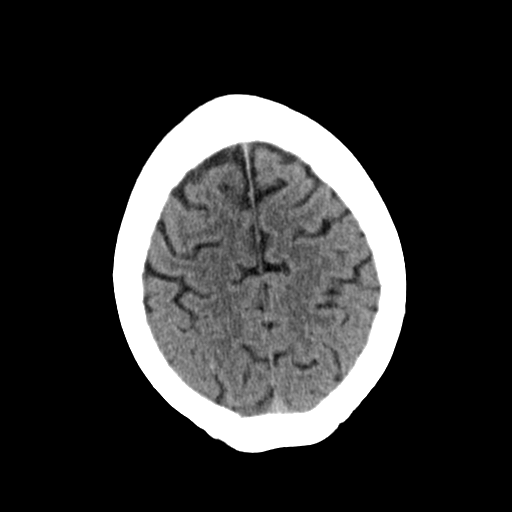
[im 26/30  brain]
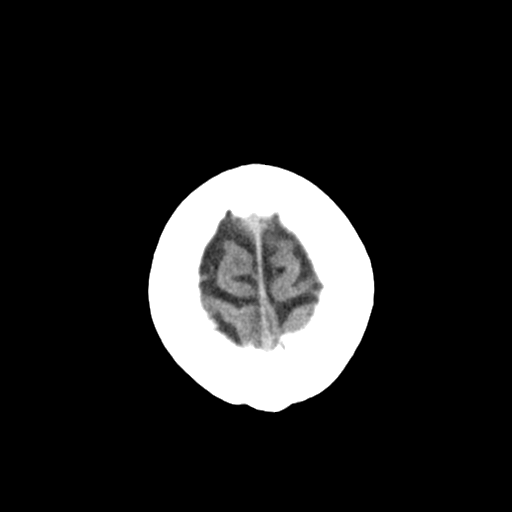

[Series 3: head bone · axial · 0.42mm/px · z∈[-164,-136]mm · 3 of 74 slices shown]
[im 8/74  bone]
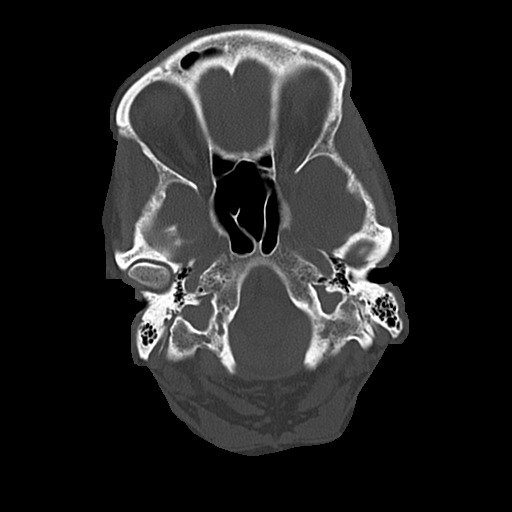
[im 15/74  bone]
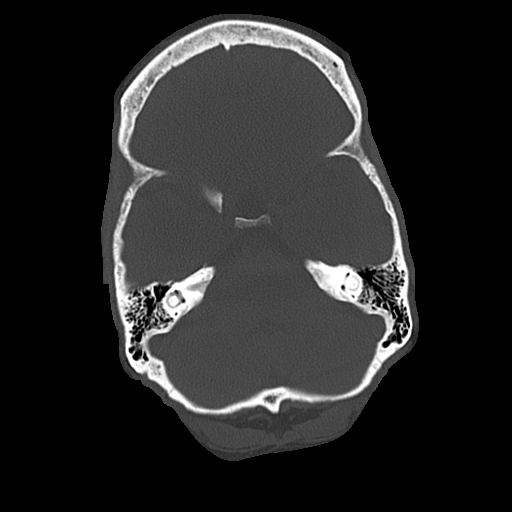
[im 22/74  bone]
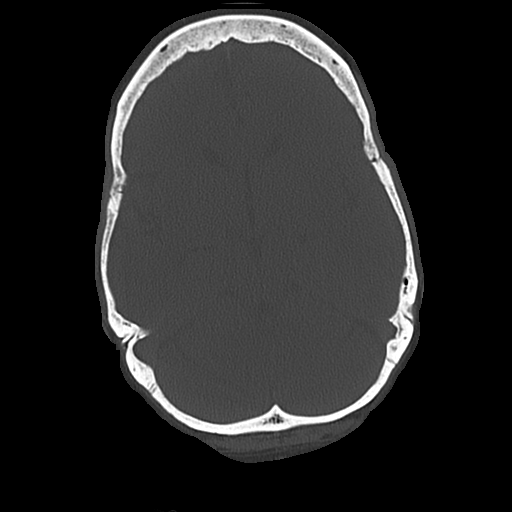

[Series 4: coronal soft · coronal · 0.29mm/px · 3 of 67 slices shown]
[im 23/67  brain]
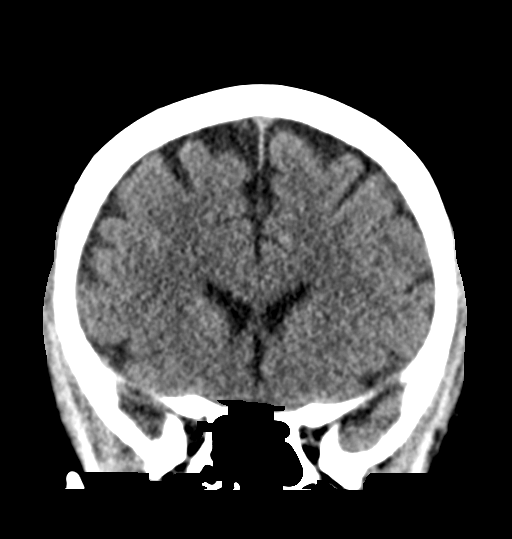
[im 30/67  brain]
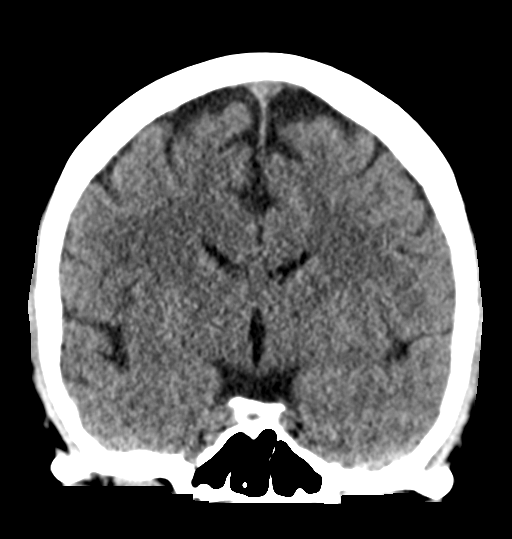
[im 37/67  brain]
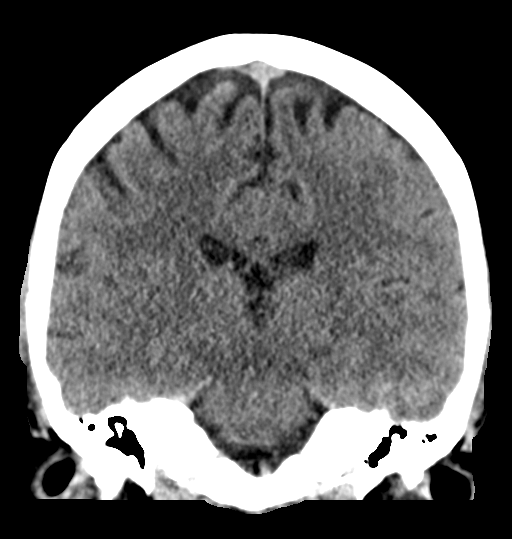

[Series 5: sagittal soft · sagittal · 0.31mm/px · 3 of 51 slices shown]
[im 17/51  brain]
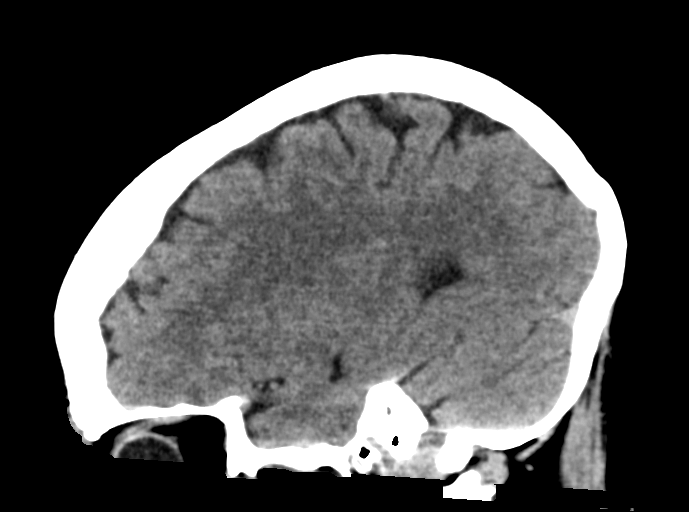
[im 26/51  brain]
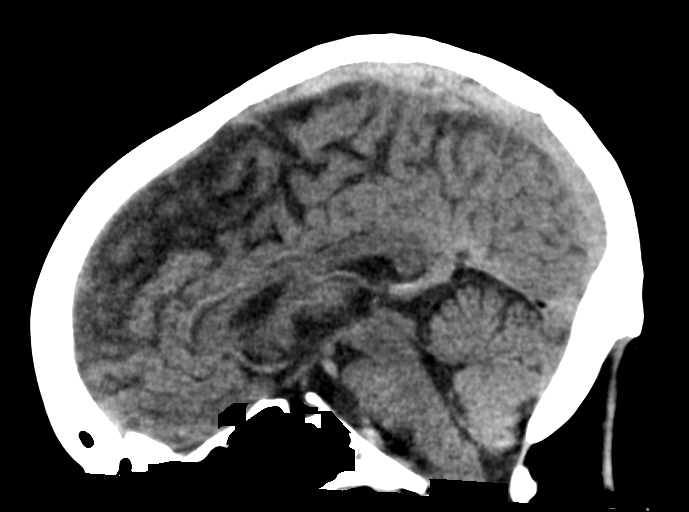
[im 34/51  brain]
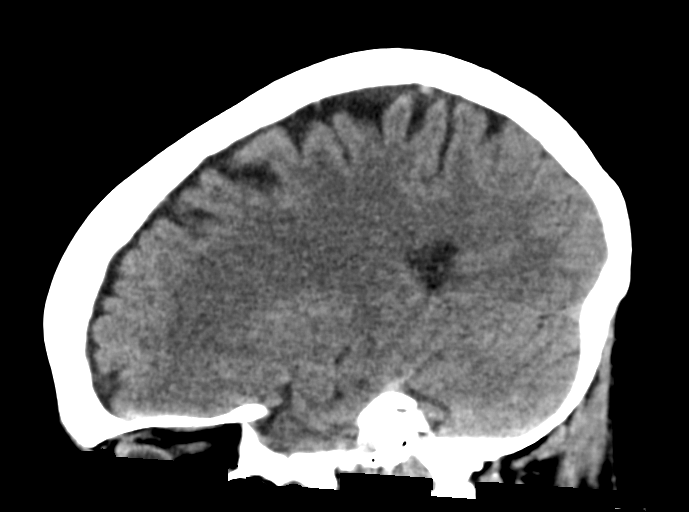

[16 of 47 positions shown; findings below may reference images not displayed]

FINDINGS: CT HEAD FINDINGS

Brain: No evidence of acute infarction, hemorrhage, hydrocephalus,
extra-axial collection or mass lesion/mass effect.

Vascular: No hyperdense vessel or unexpected calcification.

Skull: Normal. Negative for fracture or focal lesion.

Sinuses/Orbits: No acute finding.

Other: Laceration over the low right forehead.

CT CERVICAL SPINE FINDINGS

Alignment: Normal.

Skull base and vertebrae: No acute fracture. No primary bone lesion
or focal pathologic process.

Soft tissues and spinal canal: No prevertebral fluid or swelling. No
visible canal hematoma.

Disc levels:  Multilevel degenerative disc disease.

Upper chest: Negative.

Other: No other abnormalities.
IMPRESSION: 1. Laceration over the right supraorbital region.
2. No acute intracranial abnormalities.
3. No fracture or traumatic malalignment in the cervical spine.

## 2023-02-14 ENCOUNTER — Other Ambulatory Visit: Payer: Self-pay | Admitting: Nurse Practitioner

## 2023-02-14 DIAGNOSIS — Z1231 Encounter for screening mammogram for malignant neoplasm of breast: Secondary | ICD-10-CM

## 2023-02-22 ENCOUNTER — Ambulatory Visit
Admission: RE | Admit: 2023-02-22 | Discharge: 2023-02-22 | Disposition: A | Discharge status: Home / Self Care | Payer: Medicare Other | Source: Ambulatory Visit | Attending: Nurse Practitioner | Admitting: Nurse Practitioner

## 2023-02-22 DIAGNOSIS — Z1231 Encounter for screening mammogram for malignant neoplasm of breast: Secondary | ICD-10-CM

## 2024-03-06 ENCOUNTER — Telehealth: Payer: Self-pay

## 2024-03-06 NOTE — Telephone Encounter (Signed)
 Patient called & said she has medicare & wanted to know if she could still do her lipid panel with us  or if she needed to do it with a pcp. I told her she needs to get established with a pcp to do her labwork. I gave her the information with how to fine a [place] pcp online. She agrees to schedule with one.

## 2024-03-13 ENCOUNTER — Ambulatory Visit (INDEPENDENT_AMBULATORY_CARE_PROVIDER_SITE_OTHER): Admitting: Nurse Practitioner

## 2024-03-13 ENCOUNTER — Encounter: Payer: Self-pay | Admitting: Nurse Practitioner

## 2024-03-13 VITALS — BP 110/78 | HR 74 | Ht 63.25 in | Wt 151.0 lb

## 2024-03-13 DIAGNOSIS — Z01419 Encounter for gynecological examination (general) (routine) without abnormal findings: Secondary | ICD-10-CM | POA: Diagnosis not present

## 2024-03-13 DIAGNOSIS — Z780 Asymptomatic menopausal state: Secondary | ICD-10-CM

## 2024-03-13 DIAGNOSIS — M8589 Other specified disorders of bone density and structure, multiple sites: Secondary | ICD-10-CM

## 2024-03-13 NOTE — Progress Notes (Signed)
   Bonnie Manning 03-28-1956 [id number]   History:  68 y.o. G1P0001 presents for breast and pelvic exam without GYN complaints. Postmenopausal - no HRT, no bleeding. Normal pap history. History of osteopenia, vitamin D  deficiency.  Gynecologic History No LMP recorded. Patient is postmenopausal.   Contraception/Family planning: post menopausal status Sexually active: Yes  Health Maintenance Last Pap: 01/11/2022. Results were: Normal neg HPV Last mammogram: 02/22/2023. Results were: Normal Last colonoscopy: 12/19/2018. Results were: Polyps, 7-year recall Last Dexa: 02/08/2022. Results were: T-score -1.9, FRAX 10% / 1.5%  Past medical history, past surgical history, family history and social history were all reviewed and documented in the EPIC chart. Married. 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 68 yo son, lives in [place], has 2.5 yo daughter Bonnie Manning.  ROS:  A ROS was performed and pertinent positives and negatives are included.  Exam:  Vitals:   03/13/24 0949  BP: 110/78  Pulse: 74  SpO2: 99%  Weight: 151 lb (68.5 kg)  Height: 5' 3.25 (1.607 m)     Body mass index is 26.54 kg/m.  General appearance:  Normal Thyroid:  Symmetrical, normal in size, without palpable masses or nodularity. Respiratory  Auscultation:  Clear without wheezing or rhonchi Cardiovascular  Auscultation:  Regular rate, without rubs, murmurs or gallops  Edema/varicosities:  Not grossly evident Abdominal  Soft,nontender, without masses, guarding or rebound.  Liver/spleen:  No organomegaly noted  Hernia:  None appreciated  Skin  Inspection:  Grossly normal Breasts: Examined lying and sitting.   Right: Without masses, retractions, nipple discharge or axillary adenopathy.   Left: Without masses, retractions, nipple discharge or axillary adenopathy. Pelvic: External genitalia:  no lesions              Urethra:  normal appearing urethra with no masses, tenderness or lesions               Bartholins and Skenes: normal                 Vagina: normal appearing vagina with normal color and discharge, no lesions. Atrophic changes              Cervix: no lesions Bimanual Exam:  Uterus:  no masses or tenderness              Adnexa: no mass, fullness, tenderness              Rectovaginal: Deferred              Anus:  normal, no lesions  Bonnie Manning, CMA present as chaperone.   Assessment/Plan:  68 y.o. G1P0001 for breast and pelvic exam.   Encounter for breast and pelvic examination. Education provided on SBEs, importance of preventative screenings, current guidelines, high calcium diet, regular exercise, and multivitamin daily. Plans to establish with PCP.   Postmenopausal - No HRT, no bleeding.   Osteopenia of multiple sites - 02/2022-score -1.9 without elevated FRAX. Recommend vitamin D  supplement consistently. She has not been as active lately but plans to increase exercise. Wants to wait and do DXA next year.   Screening for cervical cancer - Normal Pap history. No longer screening per guidelines.   Screening for breast cancer - Normal mammogram history.  Continue annual screenings. Normal breast exam today.  Screening for colon cancer - 12/2018 colonoscopy. Will repeat at 7-year interval per GI's recommendation.  Return in about 2 years (around 03/13/2026) for B&P.    Bonnie DELENA Shutter DNP, 10:23 AM 03/13/2024

## 2024-04-16 ENCOUNTER — Other Ambulatory Visit: Payer: Self-pay | Admitting: Nurse Practitioner

## 2024-04-16 DIAGNOSIS — Z1231 Encounter for screening mammogram for malignant neoplasm of breast: Secondary | ICD-10-CM

## 2024-04-24 ENCOUNTER — Ambulatory Visit
Admission: RE | Admit: 2024-04-24 | Discharge: 2024-04-24 | Disposition: A | Discharge status: Home / Self Care | Payer: Self-pay | Source: Ambulatory Visit | Attending: Nurse Practitioner | Admitting: Nurse Practitioner

## 2024-04-24 DIAGNOSIS — Z1231 Encounter for screening mammogram for malignant neoplasm of breast: Secondary | ICD-10-CM

## 2024-07-11 ENCOUNTER — Ambulatory Visit: Payer: Self-pay | Admitting: Nurse Practitioner

## 2024-07-11 VITALS — BP 108/78 | HR 72 | Temp 97.6°F | Ht 63.0 in | Wt 153.0 lb

## 2024-07-11 DIAGNOSIS — E785 Hyperlipidemia, unspecified: Secondary | ICD-10-CM | POA: Insufficient documentation

## 2024-07-11 DIAGNOSIS — E663 Overweight: Secondary | ICD-10-CM | POA: Insufficient documentation

## 2024-07-11 DIAGNOSIS — Z6827 Body mass index (BMI) 27.0-27.9, adult: Secondary | ICD-10-CM

## 2024-07-11 NOTE — Assessment & Plan Note (Signed)
 Health maintenance and preventive care She is generally healthy, up to date on mammogram and breast exam, and plans a DEXA scan next summer. Declined shingles vaccine, declines pneumonia vaccine. - Ordered blood panel including fasting cholesterol test. - Recommended annual follow-up visit. - Advised to return for fasting blood work when convenient to monitor lipid panel and basic labs - Discussed annual Medicare wellness visit.

## 2024-07-11 NOTE — Progress Notes (Signed)
 "  New Patient Office Visit  Subjective    Patient ID: Bonnie Manning, female    DOB: [birth year]  Age: 69 y.o. MRN: [id number]  CC:  Chief Complaint  Patient presents with   New Patient (Initial Visit)    Establishing care, needing a primary provider in other for medication due to medicare, Pt was only seeing her GYN provider     HPI Bonnie Manning presents to establish care Discussed the use of AI scribe software for clinical note transcription with the patient, who gave verbal consent to proceed.  History of Present Illness Bonnie Manning is a 69 year old female who presents for a routine check-up and blood work required by [employer].  General health status - No current health concerns - History of osteopenia and this is monitored/treated by her OBGYN - History of HLD, not on medication to manage currently.   Immunization status - Received influenza vaccine in October at a pharmacy - History of mild shingles; declines shingles vaccination - Received tetanus vaccine approximately four years ago while volunteering at [facility] - Has not received pneumococcal vaccine, declines administration today  Preventive health maintenance - Last DEXA scan in 2023; plans repeat next summer - Current on mammogram and clinical breast exam    No outpatient encounter medications on file as of 07/11/2024.   No facility-administered encounter medications on file as of 07/11/2024.    Past Medical History:  Diagnosis Date   Hx of adenomatous rectal polyps 12/26/2018    Past Surgical History:  Procedure Laterality Date   CESAREAN SECTION     COLONOSCOPY  2009   in [place]-normal exam   WISDOM TOOTH EXTRACTION      Family History  Problem Relation Age of Onset   Alzheimer's disease Mother    Bladder Cancer Father    Ovarian cancer Sister    Colon polyps Sister    Colon cancer Paternal Aunt    Esophageal cancer Neg Hx    Rectal cancer Neg Hx    Stomach cancer Neg Hx    Breast  cancer Neg Hx     Social History   Socioeconomic History   Marital status: Married    Spouse name: Not on file   Number of children: Not on file   Years of education: Not on file   Highest education level: Not on file  Occupational History   Not on file  Tobacco Use   Smoking status: Never   Smokeless tobacco: Never  Vaping Use   Vaping status: Never Used  Substance and Sexual Activity   Alcohol use: Yes    Alcohol/week: 6.0 standard drinks of alcohol    Types: 6 Glasses of wine per week   Drug use: No   Sexual activity: Not Currently    Partners: Male    Birth control/protection: Post-menopausal    Comment: intercourse age 62, less than 5 sexual partners  Other Topics Concern   Not on file  Social History Narrative   Not on file   Social Drivers of Health   Tobacco Use: Low Risk (03/13/2024)   Patient History    Smoking Tobacco Use: Never    Smokeless Tobacco Use: Never    Passive Exposure: Not on file  Financial Resource Strain: Not on file  Food Insecurity: Not on file  Transportation Needs: Not on file  Physical Activity: Not on file  Stress: Not on file  Social Connections: Not on file  Intimate Partner Violence: Not on  file  Depression (PHQ2-9): Low Risk (07/11/2024)   Depression (PHQ2-9)    PHQ-2 Score: 0  Alcohol Screen: Not on file  Housing: Not on file  Utilities: Not on file  Health Literacy: Not on file         Objective    BP 108/78   Pulse 72   Temp 97.6 F (36.4 C) (Temporal)   Ht 5' 3 (1.6 m)   Wt 153 lb (69.4 kg)   SpO2 97%   BMI 27.10 kg/m   Physical Exam Vitals reviewed.  Constitutional:      General: She is not in acute distress.    Appearance: Normal appearance.  HENT:     Head: Normocephalic and atraumatic.  Cardiovascular:     Rate and Rhythm: Normal rate and regular rhythm.     Pulses: Normal pulses.     Heart sounds: Normal heart sounds.  Pulmonary:     Effort: Pulmonary effort is normal.     Breath sounds:  Normal breath sounds.  Skin:    General: Skin is warm and dry.  Neurological:     General: No focal deficit present.     Mental Status: She is alert and oriented to person, place, and time.  Psychiatric:        Mood and Affect: Mood normal.        Behavior: Behavior normal.        Judgment: Judgment normal.     Last metabolic panel Lab Results  Component Value Date   GLUCOSE 88 12/09/2020   NA 138 12/09/2020   K 4.5 12/09/2020   CL 101 12/09/2020   CO2 29 12/09/2020   BUN 19 12/09/2020   CREATININE 0.81 12/09/2020   CALCIUM 9.7 12/09/2020   PROT 7.2 12/09/2020   BILITOT 1.1 12/09/2020   AST 26 12/09/2020   ALT 26 12/09/2020   Last lipids Lab Results  Component Value Date   CHOL 283 (H) 12/09/2020   HDL 127 12/09/2020   LDLCALC 140 (H) 12/09/2020   TRIG 68 12/09/2020   CHOLHDL 2.2 12/09/2020        Assessment & Plan:   Problem List Items Addressed This Visit       Other   Hyperlipidemia - Primary   Relevant Orders   CBC   Comprehensive metabolic panel with GFR   Lipid panel   TSH   Overweight   Relevant Orders   CBC   Comprehensive metabolic panel with GFR   Lipid panel   TSH   Assessment and Plan Assessment & Plan Health maintenance and preventive care She is generally healthy, up to date on mammogram and breast exam, and plans a DEXA scan next summer. Declined shingles vaccine, declines pneumonia vaccine. - Ordered blood panel including fasting cholesterol test. - Recommended annual follow-up visit. - Advised to return for fasting blood work when convenient to monitor lipid panel and basic labs - Discussed annual Medicare wellness visit.    Return in about 1 year (around 07/11/2025) for CPE with Lauraine; AWV medicare.   Lauraine FORBES Pereyra, NP   "
